# Patient Record
Sex: Male | Born: 1940 | Race: White | Hispanic: No | Marital: Single | Smoking: Never smoker
Health system: Southern US, Community
[De-identification: ages and names within clinical notes are randomized; demographics above are authoritative.]

## PROBLEM LIST (undated history)

## (undated) DIAGNOSIS — I10 Essential (primary) hypertension: Secondary | ICD-10-CM

## (undated) DIAGNOSIS — N289 Disorder of kidney and ureter, unspecified: Secondary | ICD-10-CM

## (undated) DIAGNOSIS — I4891 Unspecified atrial fibrillation: Secondary | ICD-10-CM

## (undated) DIAGNOSIS — I700 Atherosclerosis of aorta: Secondary | ICD-10-CM

## (undated) DIAGNOSIS — I429 Cardiomyopathy, unspecified: Secondary | ICD-10-CM

## (undated) HISTORY — PX: HERNIA REPAIR: SHX51

## (undated) HISTORY — DX: Atherosclerosis of aorta: I70.0

## (undated) HISTORY — DX: Cardiomyopathy, unspecified: I42.9

## (undated) HISTORY — DX: Unspecified atrial fibrillation: I48.91

---

## 2000-12-28 ENCOUNTER — Other Ambulatory Visit: Admission: RE | Admit: 2000-12-28 | Discharge: 2000-12-28 | Payer: Self-pay | Admitting: General Surgery

## 2003-11-05 ENCOUNTER — Inpatient Hospital Stay (HOSPITAL_COMMUNITY): Admission: EM | Admit: 2003-11-05 | Discharge: 2003-11-06 | Payer: Self-pay | Admitting: Emergency Medicine

## 2006-11-01 ENCOUNTER — Emergency Department (HOSPITAL_COMMUNITY): Admission: EM | Admit: 2006-11-01 | Discharge: 2006-11-01 | Payer: Self-pay | Admitting: Emergency Medicine

## 2006-11-11 ENCOUNTER — Observation Stay (HOSPITAL_COMMUNITY): Admission: RE | Admit: 2006-11-11 | Discharge: 2006-11-12 | Payer: Self-pay | Admitting: General Surgery

## 2012-05-19 DIAGNOSIS — I10 Essential (primary) hypertension: Secondary | ICD-10-CM | POA: Diagnosis not present

## 2012-05-19 DIAGNOSIS — E782 Mixed hyperlipidemia: Secondary | ICD-10-CM | POA: Diagnosis not present

## 2012-05-19 DIAGNOSIS — E785 Hyperlipidemia, unspecified: Secondary | ICD-10-CM | POA: Diagnosis not present

## 2012-10-06 ENCOUNTER — Other Ambulatory Visit: Payer: Self-pay | Admitting: Oncology

## 2012-12-30 DIAGNOSIS — I10 Essential (primary) hypertension: Secondary | ICD-10-CM | POA: Diagnosis not present

## 2012-12-30 DIAGNOSIS — E782 Mixed hyperlipidemia: Secondary | ICD-10-CM | POA: Diagnosis not present

## 2012-12-30 DIAGNOSIS — E785 Hyperlipidemia, unspecified: Secondary | ICD-10-CM | POA: Diagnosis not present

## 2013-01-27 DIAGNOSIS — I10 Essential (primary) hypertension: Secondary | ICD-10-CM | POA: Diagnosis not present

## 2013-01-27 DIAGNOSIS — R7301 Impaired fasting glucose: Secondary | ICD-10-CM | POA: Diagnosis not present

## 2013-02-28 DIAGNOSIS — R7309 Other abnormal glucose: Secondary | ICD-10-CM | POA: Diagnosis not present

## 2013-02-28 DIAGNOSIS — I10 Essential (primary) hypertension: Secondary | ICD-10-CM | POA: Diagnosis not present

## 2013-05-31 DIAGNOSIS — I10 Essential (primary) hypertension: Secondary | ICD-10-CM | POA: Diagnosis not present

## 2013-05-31 DIAGNOSIS — E782 Mixed hyperlipidemia: Secondary | ICD-10-CM | POA: Diagnosis not present

## 2013-05-31 DIAGNOSIS — R7309 Other abnormal glucose: Secondary | ICD-10-CM | POA: Diagnosis not present

## 2013-06-07 DIAGNOSIS — E782 Mixed hyperlipidemia: Secondary | ICD-10-CM | POA: Diagnosis not present

## 2013-06-07 DIAGNOSIS — I10 Essential (primary) hypertension: Secondary | ICD-10-CM | POA: Diagnosis not present

## 2013-06-07 DIAGNOSIS — E119 Type 2 diabetes mellitus without complications: Secondary | ICD-10-CM | POA: Diagnosis not present

## 2013-09-07 DIAGNOSIS — R7301 Impaired fasting glucose: Secondary | ICD-10-CM | POA: Diagnosis not present

## 2013-09-07 DIAGNOSIS — E782 Mixed hyperlipidemia: Secondary | ICD-10-CM | POA: Diagnosis not present

## 2013-09-07 DIAGNOSIS — I10 Essential (primary) hypertension: Secondary | ICD-10-CM | POA: Diagnosis not present

## 2013-09-12 DIAGNOSIS — E782 Mixed hyperlipidemia: Secondary | ICD-10-CM | POA: Diagnosis not present

## 2013-09-12 DIAGNOSIS — I10 Essential (primary) hypertension: Secondary | ICD-10-CM | POA: Diagnosis not present

## 2013-09-12 DIAGNOSIS — E119 Type 2 diabetes mellitus without complications: Secondary | ICD-10-CM | POA: Diagnosis not present

## 2014-01-11 DIAGNOSIS — E119 Type 2 diabetes mellitus without complications: Secondary | ICD-10-CM | POA: Diagnosis not present

## 2014-01-11 DIAGNOSIS — E782 Mixed hyperlipidemia: Secondary | ICD-10-CM | POA: Diagnosis not present

## 2014-01-11 DIAGNOSIS — I10 Essential (primary) hypertension: Secondary | ICD-10-CM | POA: Diagnosis not present

## 2014-01-13 DIAGNOSIS — I10 Essential (primary) hypertension: Secondary | ICD-10-CM | POA: Diagnosis not present

## 2014-01-13 DIAGNOSIS — R7309 Other abnormal glucose: Secondary | ICD-10-CM | POA: Diagnosis not present

## 2014-01-13 DIAGNOSIS — E782 Mixed hyperlipidemia: Secondary | ICD-10-CM | POA: Diagnosis not present

## 2014-03-22 DIAGNOSIS — E782 Mixed hyperlipidemia: Secondary | ICD-10-CM | POA: Diagnosis not present

## 2014-03-22 DIAGNOSIS — I10 Essential (primary) hypertension: Secondary | ICD-10-CM | POA: Diagnosis not present

## 2014-05-04 DIAGNOSIS — E782 Mixed hyperlipidemia: Secondary | ICD-10-CM | POA: Diagnosis not present

## 2014-05-04 DIAGNOSIS — R7301 Impaired fasting glucose: Secondary | ICD-10-CM | POA: Diagnosis not present

## 2014-05-04 DIAGNOSIS — I10 Essential (primary) hypertension: Secondary | ICD-10-CM | POA: Diagnosis not present

## 2014-05-09 DIAGNOSIS — E119 Type 2 diabetes mellitus without complications: Secondary | ICD-10-CM | POA: Diagnosis not present

## 2014-05-09 DIAGNOSIS — E782 Mixed hyperlipidemia: Secondary | ICD-10-CM | POA: Diagnosis not present

## 2014-05-09 DIAGNOSIS — I10 Essential (primary) hypertension: Secondary | ICD-10-CM | POA: Diagnosis not present

## 2014-05-30 DIAGNOSIS — Z0000 Encounter for general adult medical examination without abnormal findings: Secondary | ICD-10-CM | POA: Diagnosis not present

## 2014-06-06 DIAGNOSIS — R21 Rash and other nonspecific skin eruption: Secondary | ICD-10-CM | POA: Diagnosis not present

## 2014-06-09 DIAGNOSIS — R21 Rash and other nonspecific skin eruption: Secondary | ICD-10-CM | POA: Diagnosis not present

## 2014-06-21 DIAGNOSIS — R21 Rash and other nonspecific skin eruption: Secondary | ICD-10-CM | POA: Diagnosis not present

## 2014-11-08 DIAGNOSIS — E782 Mixed hyperlipidemia: Secondary | ICD-10-CM | POA: Diagnosis not present

## 2014-11-08 DIAGNOSIS — E119 Type 2 diabetes mellitus without complications: Secondary | ICD-10-CM | POA: Diagnosis not present

## 2014-11-08 DIAGNOSIS — I10 Essential (primary) hypertension: Secondary | ICD-10-CM | POA: Diagnosis not present

## 2014-11-09 DIAGNOSIS — Z0000 Encounter for general adult medical examination without abnormal findings: Secondary | ICD-10-CM | POA: Diagnosis not present

## 2014-11-09 DIAGNOSIS — R21 Rash and other nonspecific skin eruption: Secondary | ICD-10-CM | POA: Diagnosis not present

## 2014-11-09 DIAGNOSIS — Z6834 Body mass index (BMI) 34.0-34.9, adult: Secondary | ICD-10-CM | POA: Diagnosis not present

## 2014-11-09 DIAGNOSIS — E119 Type 2 diabetes mellitus without complications: Secondary | ICD-10-CM | POA: Diagnosis not present

## 2015-02-09 DIAGNOSIS — I10 Essential (primary) hypertension: Secondary | ICD-10-CM | POA: Diagnosis not present

## 2015-02-09 DIAGNOSIS — E119 Type 2 diabetes mellitus without complications: Secondary | ICD-10-CM | POA: Diagnosis not present

## 2015-02-09 DIAGNOSIS — E782 Mixed hyperlipidemia: Secondary | ICD-10-CM | POA: Diagnosis not present

## 2015-02-12 DIAGNOSIS — E785 Hyperlipidemia, unspecified: Secondary | ICD-10-CM | POA: Diagnosis not present

## 2015-02-12 DIAGNOSIS — I10 Essential (primary) hypertension: Secondary | ICD-10-CM | POA: Diagnosis not present

## 2015-02-12 DIAGNOSIS — R7301 Impaired fasting glucose: Secondary | ICD-10-CM | POA: Diagnosis not present

## 2015-08-21 DIAGNOSIS — R7301 Impaired fasting glucose: Secondary | ICD-10-CM | POA: Diagnosis not present

## 2015-08-21 DIAGNOSIS — E785 Hyperlipidemia, unspecified: Secondary | ICD-10-CM | POA: Diagnosis not present

## 2015-08-23 DIAGNOSIS — I10 Essential (primary) hypertension: Secondary | ICD-10-CM | POA: Diagnosis not present

## 2015-08-23 DIAGNOSIS — E782 Mixed hyperlipidemia: Secondary | ICD-10-CM | POA: Diagnosis not present

## 2015-08-23 DIAGNOSIS — R7301 Impaired fasting glucose: Secondary | ICD-10-CM | POA: Diagnosis not present

## 2015-08-23 DIAGNOSIS — R944 Abnormal results of kidney function studies: Secondary | ICD-10-CM | POA: Diagnosis not present

## 2015-09-26 DIAGNOSIS — I10 Essential (primary) hypertension: Secondary | ICD-10-CM | POA: Diagnosis not present

## 2015-10-24 DIAGNOSIS — I10 Essential (primary) hypertension: Secondary | ICD-10-CM | POA: Diagnosis not present

## 2015-10-24 DIAGNOSIS — Z23 Encounter for immunization: Secondary | ICD-10-CM | POA: Diagnosis not present

## 2015-11-27 DIAGNOSIS — R5383 Other fatigue: Secondary | ICD-10-CM | POA: Diagnosis not present

## 2015-11-27 DIAGNOSIS — M791 Myalgia: Secondary | ICD-10-CM | POA: Diagnosis not present

## 2015-11-27 DIAGNOSIS — R1084 Generalized abdominal pain: Secondary | ICD-10-CM | POA: Diagnosis not present

## 2015-11-30 DIAGNOSIS — I10 Essential (primary) hypertension: Secondary | ICD-10-CM | POA: Diagnosis not present

## 2015-11-30 DIAGNOSIS — N50819 Testicular pain, unspecified: Secondary | ICD-10-CM | POA: Diagnosis not present

## 2015-12-05 ENCOUNTER — Other Ambulatory Visit (HOSPITAL_COMMUNITY): Payer: Self-pay | Admitting: Internal Medicine

## 2015-12-05 DIAGNOSIS — N432 Other hydrocele: Secondary | ICD-10-CM

## 2015-12-06 ENCOUNTER — Ambulatory Visit (HOSPITAL_COMMUNITY)
Admission: RE | Admit: 2015-12-06 | Discharge: 2015-12-06 | Disposition: A | Discharge status: Home / Self Care | Payer: Medicare Other | Source: Ambulatory Visit | Attending: Internal Medicine | Admitting: Internal Medicine

## 2015-12-06 DIAGNOSIS — N5089 Other specified disorders of the male genital organs: Secondary | ICD-10-CM | POA: Diagnosis not present

## 2015-12-06 DIAGNOSIS — N432 Other hydrocele: Secondary | ICD-10-CM | POA: Diagnosis not present

## 2015-12-06 DIAGNOSIS — R222 Localized swelling, mass and lump, trunk: Secondary | ICD-10-CM | POA: Diagnosis not present

## 2015-12-19 DIAGNOSIS — Z6837 Body mass index (BMI) 37.0-37.9, adult: Secondary | ICD-10-CM | POA: Diagnosis not present

## 2015-12-19 DIAGNOSIS — E782 Mixed hyperlipidemia: Secondary | ICD-10-CM | POA: Diagnosis not present

## 2015-12-19 DIAGNOSIS — E6609 Other obesity due to excess calories: Secondary | ICD-10-CM | POA: Diagnosis not present

## 2015-12-19 DIAGNOSIS — I10 Essential (primary) hypertension: Secondary | ICD-10-CM | POA: Diagnosis not present

## 2015-12-24 DIAGNOSIS — R7301 Impaired fasting glucose: Secondary | ICD-10-CM | POA: Diagnosis not present

## 2015-12-24 DIAGNOSIS — E782 Mixed hyperlipidemia: Secondary | ICD-10-CM | POA: Diagnosis not present

## 2015-12-24 DIAGNOSIS — I10 Essential (primary) hypertension: Secondary | ICD-10-CM | POA: Diagnosis not present

## 2015-12-26 DIAGNOSIS — I10 Essential (primary) hypertension: Secondary | ICD-10-CM | POA: Diagnosis not present

## 2015-12-26 DIAGNOSIS — R7301 Impaired fasting glucose: Secondary | ICD-10-CM | POA: Diagnosis not present

## 2015-12-26 DIAGNOSIS — E782 Mixed hyperlipidemia: Secondary | ICD-10-CM | POA: Diagnosis not present

## 2016-04-02 DIAGNOSIS — E782 Mixed hyperlipidemia: Secondary | ICD-10-CM | POA: Diagnosis not present

## 2016-04-02 DIAGNOSIS — R7301 Impaired fasting glucose: Secondary | ICD-10-CM | POA: Diagnosis not present

## 2016-04-14 DIAGNOSIS — R7301 Impaired fasting glucose: Secondary | ICD-10-CM | POA: Diagnosis not present

## 2016-04-14 DIAGNOSIS — E782 Mixed hyperlipidemia: Secondary | ICD-10-CM | POA: Diagnosis not present

## 2016-04-14 DIAGNOSIS — R944 Abnormal results of kidney function studies: Secondary | ICD-10-CM | POA: Diagnosis not present

## 2016-04-14 DIAGNOSIS — I10 Essential (primary) hypertension: Secondary | ICD-10-CM | POA: Diagnosis not present

## 2016-04-14 DIAGNOSIS — E6609 Other obesity due to excess calories: Secondary | ICD-10-CM | POA: Diagnosis not present

## 2016-05-21 DIAGNOSIS — I10 Essential (primary) hypertension: Secondary | ICD-10-CM | POA: Diagnosis not present

## 2016-05-21 DIAGNOSIS — R944 Abnormal results of kidney function studies: Secondary | ICD-10-CM | POA: Diagnosis not present

## 2016-08-13 DIAGNOSIS — E782 Mixed hyperlipidemia: Secondary | ICD-10-CM | POA: Diagnosis not present

## 2016-08-13 DIAGNOSIS — R7301 Impaired fasting glucose: Secondary | ICD-10-CM | POA: Diagnosis not present

## 2016-08-20 DIAGNOSIS — R944 Abnormal results of kidney function studies: Secondary | ICD-10-CM | POA: Diagnosis not present

## 2016-08-20 DIAGNOSIS — E782 Mixed hyperlipidemia: Secondary | ICD-10-CM | POA: Diagnosis not present

## 2016-08-20 DIAGNOSIS — I10 Essential (primary) hypertension: Secondary | ICD-10-CM | POA: Diagnosis not present

## 2016-08-20 DIAGNOSIS — R7301 Impaired fasting glucose: Secondary | ICD-10-CM | POA: Diagnosis not present

## 2016-12-10 DIAGNOSIS — R7301 Impaired fasting glucose: Secondary | ICD-10-CM | POA: Diagnosis not present

## 2016-12-10 DIAGNOSIS — E782 Mixed hyperlipidemia: Secondary | ICD-10-CM | POA: Diagnosis not present

## 2016-12-10 DIAGNOSIS — I10 Essential (primary) hypertension: Secondary | ICD-10-CM | POA: Diagnosis not present

## 2016-12-17 DIAGNOSIS — I10 Essential (primary) hypertension: Secondary | ICD-10-CM | POA: Diagnosis not present

## 2016-12-17 DIAGNOSIS — E782 Mixed hyperlipidemia: Secondary | ICD-10-CM | POA: Diagnosis not present

## 2016-12-17 DIAGNOSIS — R7301 Impaired fasting glucose: Secondary | ICD-10-CM | POA: Diagnosis not present

## 2016-12-17 DIAGNOSIS — R944 Abnormal results of kidney function studies: Secondary | ICD-10-CM | POA: Diagnosis not present

## 2016-12-17 DIAGNOSIS — Z6836 Body mass index (BMI) 36.0-36.9, adult: Secondary | ICD-10-CM | POA: Diagnosis not present

## 2016-12-17 DIAGNOSIS — E6609 Other obesity due to excess calories: Secondary | ICD-10-CM | POA: Diagnosis not present

## 2017-03-11 DIAGNOSIS — I10 Essential (primary) hypertension: Secondary | ICD-10-CM | POA: Diagnosis not present

## 2017-03-11 DIAGNOSIS — R7301 Impaired fasting glucose: Secondary | ICD-10-CM | POA: Diagnosis not present

## 2017-03-18 DIAGNOSIS — Z6835 Body mass index (BMI) 35.0-35.9, adult: Secondary | ICD-10-CM | POA: Diagnosis not present

## 2017-03-18 DIAGNOSIS — R944 Abnormal results of kidney function studies: Secondary | ICD-10-CM | POA: Diagnosis not present

## 2017-03-18 DIAGNOSIS — R7301 Impaired fasting glucose: Secondary | ICD-10-CM | POA: Diagnosis not present

## 2017-03-18 DIAGNOSIS — I10 Essential (primary) hypertension: Secondary | ICD-10-CM | POA: Diagnosis not present

## 2017-03-18 DIAGNOSIS — E782 Mixed hyperlipidemia: Secondary | ICD-10-CM | POA: Diagnosis not present

## 2017-03-18 DIAGNOSIS — Z23 Encounter for immunization: Secondary | ICD-10-CM | POA: Diagnosis not present

## 2017-09-02 DIAGNOSIS — R7301 Impaired fasting glucose: Secondary | ICD-10-CM | POA: Diagnosis not present

## 2017-09-02 DIAGNOSIS — I10 Essential (primary) hypertension: Secondary | ICD-10-CM | POA: Diagnosis not present

## 2017-09-02 DIAGNOSIS — R944 Abnormal results of kidney function studies: Secondary | ICD-10-CM | POA: Diagnosis not present

## 2017-09-02 DIAGNOSIS — E782 Mixed hyperlipidemia: Secondary | ICD-10-CM | POA: Diagnosis not present

## 2017-09-08 DIAGNOSIS — E782 Mixed hyperlipidemia: Secondary | ICD-10-CM | POA: Diagnosis not present

## 2017-09-08 DIAGNOSIS — M170 Bilateral primary osteoarthritis of knee: Secondary | ICD-10-CM | POA: Diagnosis not present

## 2017-09-08 DIAGNOSIS — R7301 Impaired fasting glucose: Secondary | ICD-10-CM | POA: Diagnosis not present

## 2017-09-08 DIAGNOSIS — Z6834 Body mass index (BMI) 34.0-34.9, adult: Secondary | ICD-10-CM | POA: Diagnosis not present

## 2017-09-08 DIAGNOSIS — I10 Essential (primary) hypertension: Secondary | ICD-10-CM | POA: Diagnosis not present

## 2017-09-08 DIAGNOSIS — M25569 Pain in unspecified knee: Secondary | ICD-10-CM | POA: Diagnosis not present

## 2017-09-08 DIAGNOSIS — R944 Abnormal results of kidney function studies: Secondary | ICD-10-CM | POA: Diagnosis not present

## 2018-03-05 DIAGNOSIS — E782 Mixed hyperlipidemia: Secondary | ICD-10-CM | POA: Diagnosis not present

## 2018-03-05 DIAGNOSIS — N429 Disorder of prostate, unspecified: Secondary | ICD-10-CM | POA: Diagnosis not present

## 2018-03-05 DIAGNOSIS — R7301 Impaired fasting glucose: Secondary | ICD-10-CM | POA: Diagnosis not present

## 2018-03-10 DIAGNOSIS — Z6835 Body mass index (BMI) 35.0-35.9, adult: Secondary | ICD-10-CM | POA: Diagnosis not present

## 2018-03-10 DIAGNOSIS — E6609 Other obesity due to excess calories: Secondary | ICD-10-CM | POA: Diagnosis not present

## 2018-03-10 DIAGNOSIS — R7301 Impaired fasting glucose: Secondary | ICD-10-CM | POA: Diagnosis not present

## 2018-03-10 DIAGNOSIS — E782 Mixed hyperlipidemia: Secondary | ICD-10-CM | POA: Diagnosis not present

## 2018-03-10 DIAGNOSIS — R944 Abnormal results of kidney function studies: Secondary | ICD-10-CM | POA: Diagnosis not present

## 2018-03-10 DIAGNOSIS — R972 Elevated prostate specific antigen [PSA]: Secondary | ICD-10-CM | POA: Diagnosis not present

## 2018-03-10 DIAGNOSIS — I10 Essential (primary) hypertension: Secondary | ICD-10-CM | POA: Diagnosis not present

## 2018-04-05 DIAGNOSIS — Z6835 Body mass index (BMI) 35.0-35.9, adult: Secondary | ICD-10-CM | POA: Diagnosis not present

## 2018-04-05 DIAGNOSIS — E6609 Other obesity due to excess calories: Secondary | ICD-10-CM | POA: Diagnosis not present

## 2018-04-05 DIAGNOSIS — I10 Essential (primary) hypertension: Secondary | ICD-10-CM | POA: Diagnosis not present

## 2018-04-05 DIAGNOSIS — M170 Bilateral primary osteoarthritis of knee: Secondary | ICD-10-CM | POA: Diagnosis not present

## 2018-04-05 DIAGNOSIS — E782 Mixed hyperlipidemia: Secondary | ICD-10-CM | POA: Diagnosis not present

## 2018-04-05 DIAGNOSIS — M25569 Pain in unspecified knee: Secondary | ICD-10-CM | POA: Diagnosis not present

## 2018-04-05 DIAGNOSIS — R7301 Impaired fasting glucose: Secondary | ICD-10-CM | POA: Diagnosis not present

## 2018-04-05 DIAGNOSIS — Z6837 Body mass index (BMI) 37.0-37.9, adult: Secondary | ICD-10-CM | POA: Diagnosis not present

## 2018-04-05 DIAGNOSIS — R944 Abnormal results of kidney function studies: Secondary | ICD-10-CM | POA: Diagnosis not present

## 2018-04-05 DIAGNOSIS — R972 Elevated prostate specific antigen [PSA]: Secondary | ICD-10-CM | POA: Diagnosis not present

## 2018-04-05 DIAGNOSIS — Z6834 Body mass index (BMI) 34.0-34.9, adult: Secondary | ICD-10-CM | POA: Diagnosis not present

## 2018-09-08 DIAGNOSIS — E782 Mixed hyperlipidemia: Secondary | ICD-10-CM | POA: Diagnosis not present

## 2018-09-08 DIAGNOSIS — Z6837 Body mass index (BMI) 37.0-37.9, adult: Secondary | ICD-10-CM | POA: Diagnosis not present

## 2018-09-08 DIAGNOSIS — I10 Essential (primary) hypertension: Secondary | ICD-10-CM | POA: Diagnosis not present

## 2018-09-08 DIAGNOSIS — R7301 Impaired fasting glucose: Secondary | ICD-10-CM | POA: Diagnosis not present

## 2018-09-08 DIAGNOSIS — R944 Abnormal results of kidney function studies: Secondary | ICD-10-CM | POA: Diagnosis not present

## 2018-09-15 DIAGNOSIS — R7301 Impaired fasting glucose: Secondary | ICD-10-CM | POA: Diagnosis not present

## 2018-09-15 DIAGNOSIS — E669 Obesity, unspecified: Secondary | ICD-10-CM | POA: Diagnosis not present

## 2018-09-15 DIAGNOSIS — M170 Bilateral primary osteoarthritis of knee: Secondary | ICD-10-CM | POA: Diagnosis not present

## 2018-09-15 DIAGNOSIS — S39012A Strain of muscle, fascia and tendon of lower back, initial encounter: Secondary | ICD-10-CM | POA: Diagnosis not present

## 2018-09-15 DIAGNOSIS — Z23 Encounter for immunization: Secondary | ICD-10-CM | POA: Diagnosis not present

## 2018-09-15 DIAGNOSIS — N183 Chronic kidney disease, stage 3 (moderate): Secondary | ICD-10-CM | POA: Diagnosis not present

## 2018-09-15 DIAGNOSIS — I10 Essential (primary) hypertension: Secondary | ICD-10-CM | POA: Diagnosis not present

## 2018-09-15 DIAGNOSIS — R944 Abnormal results of kidney function studies: Secondary | ICD-10-CM | POA: Diagnosis not present

## 2019-02-14 DIAGNOSIS — I10 Essential (primary) hypertension: Secondary | ICD-10-CM | POA: Diagnosis not present

## 2019-02-14 DIAGNOSIS — N183 Chronic kidney disease, stage 3 (moderate): Secondary | ICD-10-CM | POA: Diagnosis not present

## 2019-02-14 DIAGNOSIS — M170 Bilateral primary osteoarthritis of knee: Secondary | ICD-10-CM | POA: Diagnosis not present

## 2019-03-23 DIAGNOSIS — I10 Essential (primary) hypertension: Secondary | ICD-10-CM | POA: Diagnosis not present

## 2019-03-23 DIAGNOSIS — R7301 Impaired fasting glucose: Secondary | ICD-10-CM | POA: Diagnosis not present

## 2019-03-23 DIAGNOSIS — R972 Elevated prostate specific antigen [PSA]: Secondary | ICD-10-CM | POA: Diagnosis not present

## 2019-03-23 DIAGNOSIS — R944 Abnormal results of kidney function studies: Secondary | ICD-10-CM | POA: Diagnosis not present

## 2019-03-23 DIAGNOSIS — E782 Mixed hyperlipidemia: Secondary | ICD-10-CM | POA: Diagnosis not present

## 2019-03-28 DIAGNOSIS — E669 Obesity, unspecified: Secondary | ICD-10-CM | POA: Diagnosis not present

## 2019-03-28 DIAGNOSIS — R600 Localized edema: Secondary | ICD-10-CM | POA: Diagnosis not present

## 2019-03-28 DIAGNOSIS — I10 Essential (primary) hypertension: Secondary | ICD-10-CM | POA: Diagnosis not present

## 2019-03-28 DIAGNOSIS — M25569 Pain in unspecified knee: Secondary | ICD-10-CM | POA: Diagnosis not present

## 2019-03-28 DIAGNOSIS — Z0000 Encounter for general adult medical examination without abnormal findings: Secondary | ICD-10-CM | POA: Diagnosis not present

## 2019-03-28 DIAGNOSIS — M170 Bilateral primary osteoarthritis of knee: Secondary | ICD-10-CM | POA: Diagnosis not present

## 2019-03-28 DIAGNOSIS — R944 Abnormal results of kidney function studies: Secondary | ICD-10-CM | POA: Diagnosis not present

## 2019-03-28 DIAGNOSIS — R7303 Prediabetes: Secondary | ICD-10-CM | POA: Diagnosis not present

## 2019-03-28 DIAGNOSIS — R7301 Impaired fasting glucose: Secondary | ICD-10-CM | POA: Diagnosis not present

## 2019-03-28 DIAGNOSIS — N183 Chronic kidney disease, stage 3 (moderate): Secondary | ICD-10-CM | POA: Diagnosis not present

## 2019-03-28 DIAGNOSIS — E875 Hyperkalemia: Secondary | ICD-10-CM | POA: Diagnosis not present

## 2019-03-28 DIAGNOSIS — S39012A Strain of muscle, fascia and tendon of lower back, initial encounter: Secondary | ICD-10-CM | POA: Diagnosis not present

## 2019-03-31 ENCOUNTER — Other Ambulatory Visit: Payer: Self-pay

## 2019-04-20 DIAGNOSIS — R972 Elevated prostate specific antigen [PSA]: Secondary | ICD-10-CM | POA: Diagnosis not present

## 2019-04-20 DIAGNOSIS — R600 Localized edema: Secondary | ICD-10-CM | POA: Diagnosis not present

## 2019-04-20 DIAGNOSIS — Z6834 Body mass index (BMI) 34.0-34.9, adult: Secondary | ICD-10-CM | POA: Diagnosis not present

## 2019-04-20 DIAGNOSIS — M170 Bilateral primary osteoarthritis of knee: Secondary | ICD-10-CM | POA: Diagnosis not present

## 2019-04-20 DIAGNOSIS — R7301 Impaired fasting glucose: Secondary | ICD-10-CM | POA: Diagnosis not present

## 2019-04-20 DIAGNOSIS — R7303 Prediabetes: Secondary | ICD-10-CM | POA: Diagnosis not present

## 2019-04-20 DIAGNOSIS — E669 Obesity, unspecified: Secondary | ICD-10-CM | POA: Diagnosis not present

## 2019-04-20 DIAGNOSIS — N183 Chronic kidney disease, stage 3 (moderate): Secondary | ICD-10-CM | POA: Diagnosis not present

## 2019-04-20 DIAGNOSIS — I10 Essential (primary) hypertension: Secondary | ICD-10-CM | POA: Diagnosis not present

## 2019-04-20 DIAGNOSIS — M25569 Pain in unspecified knee: Secondary | ICD-10-CM | POA: Diagnosis not present

## 2019-04-20 DIAGNOSIS — Z6837 Body mass index (BMI) 37.0-37.9, adult: Secondary | ICD-10-CM | POA: Diagnosis not present

## 2019-04-20 DIAGNOSIS — E875 Hyperkalemia: Secondary | ICD-10-CM | POA: Diagnosis not present

## 2019-04-20 DIAGNOSIS — E6609 Other obesity due to excess calories: Secondary | ICD-10-CM | POA: Diagnosis not present

## 2019-04-20 DIAGNOSIS — E782 Mixed hyperlipidemia: Secondary | ICD-10-CM | POA: Diagnosis not present

## 2019-04-20 DIAGNOSIS — R944 Abnormal results of kidney function studies: Secondary | ICD-10-CM | POA: Diagnosis not present

## 2019-09-08 DIAGNOSIS — R944 Abnormal results of kidney function studies: Secondary | ICD-10-CM | POA: Diagnosis not present

## 2019-09-08 DIAGNOSIS — I10 Essential (primary) hypertension: Secondary | ICD-10-CM | POA: Diagnosis not present

## 2019-09-08 DIAGNOSIS — R600 Localized edema: Secondary | ICD-10-CM | POA: Diagnosis not present

## 2019-09-08 DIAGNOSIS — R7303 Prediabetes: Secondary | ICD-10-CM | POA: Diagnosis not present

## 2019-09-08 DIAGNOSIS — R7301 Impaired fasting glucose: Secondary | ICD-10-CM | POA: Diagnosis not present

## 2019-09-08 DIAGNOSIS — E875 Hyperkalemia: Secondary | ICD-10-CM | POA: Diagnosis not present

## 2019-09-08 DIAGNOSIS — Z6837 Body mass index (BMI) 37.0-37.9, adult: Secondary | ICD-10-CM | POA: Diagnosis not present

## 2019-09-08 DIAGNOSIS — E782 Mixed hyperlipidemia: Secondary | ICD-10-CM | POA: Diagnosis not present

## 2019-09-08 DIAGNOSIS — R972 Elevated prostate specific antigen [PSA]: Secondary | ICD-10-CM | POA: Diagnosis not present

## 2019-09-08 DIAGNOSIS — M25569 Pain in unspecified knee: Secondary | ICD-10-CM | POA: Diagnosis not present

## 2019-09-08 DIAGNOSIS — E6609 Other obesity due to excess calories: Secondary | ICD-10-CM | POA: Diagnosis not present

## 2019-09-08 DIAGNOSIS — Z6834 Body mass index (BMI) 34.0-34.9, adult: Secondary | ICD-10-CM | POA: Diagnosis not present

## 2020-03-15 DIAGNOSIS — Z6834 Body mass index (BMI) 34.0-34.9, adult: Secondary | ICD-10-CM | POA: Diagnosis not present

## 2020-03-15 DIAGNOSIS — M25569 Pain in unspecified knee: Secondary | ICD-10-CM | POA: Diagnosis not present

## 2020-03-15 DIAGNOSIS — R944 Abnormal results of kidney function studies: Secondary | ICD-10-CM | POA: Diagnosis not present

## 2020-03-15 DIAGNOSIS — R7301 Impaired fasting glucose: Secondary | ICD-10-CM | POA: Diagnosis not present

## 2020-03-15 DIAGNOSIS — E6609 Other obesity due to excess calories: Secondary | ICD-10-CM | POA: Diagnosis not present

## 2020-03-15 DIAGNOSIS — E782 Mixed hyperlipidemia: Secondary | ICD-10-CM | POA: Diagnosis not present

## 2020-03-15 DIAGNOSIS — R7303 Prediabetes: Secondary | ICD-10-CM | POA: Diagnosis not present

## 2020-03-15 DIAGNOSIS — I10 Essential (primary) hypertension: Secondary | ICD-10-CM | POA: Diagnosis not present

## 2020-03-15 DIAGNOSIS — N1831 Chronic kidney disease, stage 3a: Secondary | ICD-10-CM | POA: Diagnosis not present

## 2020-03-15 DIAGNOSIS — E875 Hyperkalemia: Secondary | ICD-10-CM | POA: Diagnosis not present

## 2020-03-15 DIAGNOSIS — R600 Localized edema: Secondary | ICD-10-CM | POA: Diagnosis not present

## 2020-03-15 DIAGNOSIS — Z6837 Body mass index (BMI) 37.0-37.9, adult: Secondary | ICD-10-CM | POA: Diagnosis not present

## 2020-03-20 DIAGNOSIS — M170 Bilateral primary osteoarthritis of knee: Secondary | ICD-10-CM | POA: Diagnosis not present

## 2020-03-20 DIAGNOSIS — E669 Obesity, unspecified: Secondary | ICD-10-CM | POA: Diagnosis not present

## 2020-03-20 DIAGNOSIS — N1831 Chronic kidney disease, stage 3a: Secondary | ICD-10-CM | POA: Diagnosis not present

## 2020-03-20 DIAGNOSIS — R944 Abnormal results of kidney function studies: Secondary | ICD-10-CM | POA: Diagnosis not present

## 2020-03-20 DIAGNOSIS — I10 Essential (primary) hypertension: Secondary | ICD-10-CM | POA: Diagnosis not present

## 2020-03-20 DIAGNOSIS — Z0001 Encounter for general adult medical examination with abnormal findings: Secondary | ICD-10-CM | POA: Diagnosis not present

## 2020-03-20 DIAGNOSIS — R600 Localized edema: Secondary | ICD-10-CM | POA: Diagnosis not present

## 2020-03-20 DIAGNOSIS — R7301 Impaired fasting glucose: Secondary | ICD-10-CM | POA: Diagnosis not present

## 2020-03-20 DIAGNOSIS — R7303 Prediabetes: Secondary | ICD-10-CM | POA: Diagnosis not present

## 2020-03-20 DIAGNOSIS — M25569 Pain in unspecified knee: Secondary | ICD-10-CM | POA: Diagnosis not present

## 2020-04-15 ENCOUNTER — Encounter (HOSPITAL_COMMUNITY): Payer: Self-pay | Admitting: Emergency Medicine

## 2020-04-15 ENCOUNTER — Observation Stay (HOSPITAL_COMMUNITY)
Admission: EM | Admit: 2020-04-15 | Discharge: 2020-04-17 | Disposition: A | Discharge status: Home / Self Care | Payer: Medicare Other | Attending: Internal Medicine | Admitting: Internal Medicine

## 2020-04-15 ENCOUNTER — Emergency Department (HOSPITAL_COMMUNITY): Payer: Medicare Other

## 2020-04-15 ENCOUNTER — Other Ambulatory Visit: Payer: Self-pay

## 2020-04-15 DIAGNOSIS — R0602 Shortness of breath: Secondary | ICD-10-CM | POA: Diagnosis not present

## 2020-04-15 DIAGNOSIS — N179 Acute kidney failure, unspecified: Secondary | ICD-10-CM | POA: Diagnosis not present

## 2020-04-15 DIAGNOSIS — I480 Paroxysmal atrial fibrillation: Secondary | ICD-10-CM | POA: Diagnosis not present

## 2020-04-15 DIAGNOSIS — D3501 Benign neoplasm of right adrenal gland: Secondary | ICD-10-CM

## 2020-04-15 DIAGNOSIS — I129 Hypertensive chronic kidney disease with stage 1 through stage 4 chronic kidney disease, or unspecified chronic kidney disease: Secondary | ICD-10-CM | POA: Diagnosis not present

## 2020-04-15 DIAGNOSIS — N390 Urinary tract infection, site not specified: Secondary | ICD-10-CM

## 2020-04-15 DIAGNOSIS — R600 Localized edema: Secondary | ICD-10-CM | POA: Diagnosis not present

## 2020-04-15 DIAGNOSIS — N400 Enlarged prostate without lower urinary tract symptoms: Secondary | ICD-10-CM

## 2020-04-15 DIAGNOSIS — I4891 Unspecified atrial fibrillation: Secondary | ICD-10-CM | POA: Diagnosis not present

## 2020-04-15 DIAGNOSIS — Z6838 Body mass index (BMI) 38.0-38.9, adult: Secondary | ICD-10-CM | POA: Insufficient documentation

## 2020-04-15 DIAGNOSIS — N401 Enlarged prostate with lower urinary tract symptoms: Secondary | ICD-10-CM | POA: Insufficient documentation

## 2020-04-15 DIAGNOSIS — E785 Hyperlipidemia, unspecified: Secondary | ICD-10-CM | POA: Insufficient documentation

## 2020-04-15 DIAGNOSIS — E669 Obesity, unspecified: Secondary | ICD-10-CM | POA: Diagnosis not present

## 2020-04-15 DIAGNOSIS — Z79899 Other long term (current) drug therapy: Secondary | ICD-10-CM | POA: Insufficient documentation

## 2020-04-15 DIAGNOSIS — R748 Abnormal levels of other serum enzymes: Secondary | ICD-10-CM | POA: Diagnosis not present

## 2020-04-15 DIAGNOSIS — N189 Chronic kidney disease, unspecified: Secondary | ICD-10-CM | POA: Insufficient documentation

## 2020-04-15 DIAGNOSIS — Z20822 Contact with and (suspected) exposure to covid-19: Secondary | ICD-10-CM | POA: Insufficient documentation

## 2020-04-15 DIAGNOSIS — I959 Hypotension, unspecified: Secondary | ICD-10-CM | POA: Diagnosis not present

## 2020-04-15 DIAGNOSIS — R7989 Other specified abnormal findings of blood chemistry: Secondary | ICD-10-CM

## 2020-04-15 DIAGNOSIS — R319 Hematuria, unspecified: Secondary | ICD-10-CM

## 2020-04-15 HISTORY — DX: Disorder of kidney and ureter, unspecified: N28.9

## 2020-04-15 HISTORY — DX: Essential (primary) hypertension: I10

## 2020-04-15 NOTE — ED Triage Notes (Signed)
Pt states he is "peeing blood". States it started at 0400 this morning.

## 2020-04-15 NOTE — ED Provider Notes (Signed)
[facility] EMERGENCY DEPARTMENT Provider Note   CSN: [id number] Arrival date & time: 04/15/20  2301     History Chief Complaint  Patient presents with  . Hematuria    Kevin Rios is a 79 y.o. male.  Patient with history of hypertension and hypercholesterolemia here with hematuria since approximately 4 AM.  States he has been urinating blood mixed with urine all day.  Does have some dysuria.  No abdominal pain or back pain.  No fever or vomiting. On arrival he was found to be tachycardic and hypotensive.  He is in A. fib with RVR with a blood pressure of 90 systolic.  He denies any history of A. fib or CHF.  States he was never told he had any heart issues.  His PCP had him on Lasix in the past which he takes intermittently for swelling but has not had it for several days.  Does not take any blood thinners. He denies any chest pain or shortness of breath.  Has had progressively worsening leg swelling for the past several days.  There is no history of A. fib previously.  He denies any palpitations or chest pain.  He denies any dizziness or lightheadedness.  The history is provided by the patient.  Hematuria Pertinent negatives include no abdominal pain, no headaches and no shortness of breath.       Past Medical History:  Diagnosis Date  . Hypertension   . Renal disorder     There are no problems to display for this patient.   Past Surgical History:  Procedure Laterality Date  . HERNIA REPAIR         History reviewed. No pertinent family history.  Social History   Tobacco Use  . Smoking status: Never Smoker  . Smokeless tobacco: Never Used  Vaping Use  . Vaping Use: Never used  Substance Use Topics  . Alcohol use: Yes    Comment: occassionally  . Drug use: Never    Home Medications Prior to Admission medications   Not on File    Allergies    Patient has no known allergies.  Review of Systems   Review of Systems  Constitutional: Negative for  activity change, appetite change and fever.  HENT: Negative for congestion and rhinorrhea.   Respiratory: Negative for cough, chest tightness and shortness of breath.   Cardiovascular: Positive for palpitations and leg swelling.  Gastrointestinal: Negative for abdominal pain, nausea and vomiting.  Genitourinary: Positive for dysuria and hematuria. Negative for decreased urine volume, flank pain, testicular pain and urgency.  Musculoskeletal: Negative for arthralgias and myalgias.  Skin: Negative for rash.  Neurological: Negative for dizziness, weakness and headaches.   all other systems are negative except as noted in the HPI and PMH.    Physical Exam Updated Vital Signs BP 120/75 (BP Location: Left Arm)   Pulse (!) 123   Temp 98.4 F (36.9 C) (Oral)   Resp (!) 23   Ht 5\' 8"  (1.727 m)   Wt 113.4 kg   SpO2 96%   BMI 38.01 kg/m   Physical Exam Vitals and nursing note reviewed.  Constitutional:      General: He is not in acute distress.    Appearance: He is well-developed. He is not ill-appearing.     Comments: Disheveled, dyspneic with conversation  HENT:     Head: Normocephalic and atraumatic.     Nose: Nose normal. No congestion or rhinorrhea.     Mouth/Throat:  Mouth: Mucous membranes are moist.     Pharynx: No oropharyngeal exudate.  Eyes:     Conjunctiva/sclera: Conjunctivae normal.     Pupils: Pupils are equal, round, and reactive to light.  Neck:     Comments: No meningismus. Cardiovascular:     Rate and Rhythm: Tachycardia present. Rhythm irregular.     Heart sounds: Normal heart sounds. No murmur heard.      Comments: irregular tachycardia 120s to 140s Pulmonary:     Effort: Pulmonary effort is normal. No respiratory distress.     Breath sounds: Normal breath sounds.  Abdominal:     Palpations: Abdomen is soft.     Tenderness: There is no abdominal tenderness. There is no guarding or rebound.  Genitourinary:    Comments: No testicular tenderness.  No  frank blood noted to the urethral meatus Musculoskeletal:        General: No tenderness. Normal range of motion.     Cervical back: Normal range of motion and neck supple.     Right lower leg: Edema present.     Left lower leg: Edema present.     Comments: No CVA tenderness  +2 edema to knees bilaterally  Skin:    General: Skin is warm.  Neurological:     Mental Status: He is alert and oriented to person, place, and time.     Cranial Nerves: No cranial nerve deficit.     Motor: No abnormal muscle tone.     Coordination: Coordination normal.     Comments: No ataxia on finger to nose bilaterally. No pronator drift. 5/5 strength throughout. CN 2-12 intact.Equal grip strength. Sensation intact.   Psychiatric:        Behavior: Behavior normal.     ED Results / Procedures / Treatments   Labs (all labs ordered are listed, but only abnormal results are displayed) Labs Reviewed  URINALYSIS, ROUTINE W REFLEX MICROSCOPIC - Abnormal; Notable for the following components:      Result Value   Hgb urine dipstick LARGE (*)    Bacteria, UA RARE (*)    All other components within normal limits  CBC WITH DIFFERENTIAL/PLATELET - Abnormal; Notable for the following components:   Neutro Abs 8.4 (*)    Lymphs Abs 0.4 (*)    All other components within normal limits  COMPREHENSIVE METABOLIC PANEL - Abnormal; Notable for the following components:   CO2 19 (*)    Glucose, Bld 117 (*)    BUN 33 (*)    Creatinine, Ser 1.72 (*)    Calcium 8.8 (*)    GFR calc non Af Amer 38 (*)    GFR calc Af Amer 44 (*)    All other components within normal limits  BRAIN NATRIURETIC PEPTIDE - Abnormal; Notable for the following components:   B Natriuretic Peptide 145.0 (*)    All other components within normal limits  SARS CORONAVIRUS 2 BY RT PCR (HOSPITAL ORDER, [place] [facility])  URINE CULTURE  CK  TROPONIN I (HIGH SENSITIVITY)  TROPONIN I (HIGH SENSITIVITY)    EKG EKG  Interpretation  Date/Time:  Sunday April 15 2020 23:25:20 EDT Ventricular Rate:  123 PR Interval:    QRS Duration: 73 QT Interval:  282 QTC Calculation: 404 R Axis:   37 Text Interpretation: Atrial fibrillation Low voltage, precordial leads Anteroseptal infarct, old Borderline repolarization abnormality afib with RVR Nonspecific ST abnormality Confirmed by Ezequiel Essex [phone]) on 04/15/2020 11:41:53 PM   Radiology DG Chest  Portable 1 View  Result Date: 04/16/2020 CLINICAL DATA:  Shortness of breath EXAM: PORTABLE CHEST 1 VIEW COMPARISON:  None. FINDINGS: The heart size and mediastinal contours are within normal limits. Both lungs are clear. The visualized skeletal structures are unremarkable. IMPRESSION: No active disease. Electronically Signed   By: Donavan Foil M.D.   On: 04/16/2020 00:54   CT Renal Stone Study  Result Date: 04/16/2020 CLINICAL DATA:  79 year old with hematuria. EXAM: CT ABDOMEN AND PELVIS WITHOUT CONTRAST TECHNIQUE: Multidetector CT imaging of the abdomen and pelvis was performed following the standard protocol without IV contrast. COMPARISON:  Remote pelvis CT 11/01/2006 FINDINGS: Lower chest: Calcified granuloma in the right lower lobe. There is a tiny subpleural nodule in the right lower lobe, series 4, image 3. No pleural fluid or focal airspace disease. Hepatobiliary: Slight generalized increased hepatic density consistent with amiodarone therapy. No focal hepatic abnormality. Punctate calcified granuloma. Gallbladder physiologically distended, no calcified stone. No biliary dilatation. Pancreas: No ductal dilatation or inflammation. Mild fatty atrophy of the pancreatic head. Spleen: Normal in size without focal abnormality. Adrenals/Urinary Tract: 14 mm low-density nodule in the right adrenal gland consistent with adenoma. The left adrenal gland is normal. There is bilateral renal parenchymal thinning. Symmetric bilateral perinephric edema. No hydronephrosis. No  renal calculi. Suspect small cortical and parapelvic cysts in the left kidney. No evidence of solid renal lesion. Both ureters are decompressed without stones along the course. Urinary bladder is partially distended. There is no bladder wall thickening. No bladder stone. No obvious bladder mass. Stomach/Bowel: Ingested material in the stomach. Normal positioning of the duodenum and ligament of Treitz. There is no small bowel obstruction or inflammatory change. Mild fecalization of distal small bowel contents. Diminutive appendix tentatively visual. No appendicitis. Small to moderate colonic stool burden. Mild diverticulosis involving the descending and sigmoid colon. No diverticulitis. Vascular/Lymphatic: Aorto bi-iliac atherosclerosis. No aortic aneurysm. No enlarged lymph nodes in the abdomen or pelvis. No inguinal adenopathy. Reproductive: Enlarged prostate gland spanning 5.7 x 4.5 x 5.7 cm (volume = 77 cm^3). This causes mass effect on the bladder base. Other: Moderate to large right and small left fat containing inguinal hernia. No bowel involvement. No ascites. No free air. Musculoskeletal: Degenerative change in the lumbar spine and both hips. There are no acute or suspicious osseous abnormalities. IMPRESSION: 1. No renal stones or obstructive uropathy. No explanation for hematuria. Consider elective pre and postcontrast hematuria protocol CT should symptoms persist. 2. Suspected small cortical and parapelvic cysts in the left kidney, not well assessed in the absence of IV contrast. 3. Enlarged prostate gland causing mass effect on the bladder base. Recommend correlation with PSA. 4. Moderate to large right and small left fat containing inguinal hernia. No bowel involvement. 5. Mild colonic diverticulosis without diverticulitis. 6. Right adrenal adenoma. Aortic Atherosclerosis (ICD10-I70.0). Electronically Signed   By: Keith Rake M.D.   On: 04/16/2020 01:24    Procedures .Critical Care Performed  by: Ezequiel Essex, MD Authorized by: Ezequiel Essex, MD   Critical care provider statement:    Critical care time (minutes):  45   Critical care was necessary to treat or prevent imminent or life-threatening deterioration of the following conditions:  Cardiac failure (Atrial fibrillation with RVR)   Critical care was time spent personally by me on the following activities:  Discussions with consultants, evaluation of patient's response to treatment, examination of patient, ordering and performing treatments and interventions, ordering and review of laboratory studies, ordering and review of radiographic studies, pulse  oximetry, re-evaluation of patient's condition, obtaining history from patient or surrogate and review of old charts   (including critical care time)  Medications Ordered in ED Medications - No data to display  ED Course  I have reviewed the triage vital signs and the nursing notes.  Pertinent labs & imaging results that were available during my care of the patient were reviewed by me and considered in my medical decision making (see chart for details).    MDM Rules/Calculators/A&P                         Patient presents with hematuria and dysuria since 4 AM.  He is found to be in A. fib with RVR, hypotensive, dyspneic with leg swelling.  Concern for decompensated heart failure.  Patient is new atrial fibrillation with RVR.  He was placed on amiodarone for rate control given his soft blood pressures.  He denies any chest pain or shortness of breath but does appear dyspneic with conversation.  Urine is positive for hematuria without evidence of infection.  Culture is sent.  CT scan shows no explanation for hematuria, no obstructive stones or masses. Creatinine 1.7 without comparison. Heart rate improved to 90s and 100s.  Blood pressure improved to 726 systolic.  Chest x-ray is clear.  BNP is normal.  Does have peripheral edema.  Continue amiodarone for rate  control.  Will give Rocephin for possible hemorrhagic cystitis.  Admission for A. fib with RVR discussed with Dr. Josephine Cables.   Final Clinical Impression(s) / ED Diagnoses Final diagnoses:  Hematuria, unspecified type  Atrial fibrillation with RVR [facility])    Rx / DC Orders ED Discharge Orders    None       Cartrell Bentsen, Annie Main, MD 04/16/20 [id number]

## 2020-04-16 ENCOUNTER — Emergency Department (HOSPITAL_COMMUNITY): Payer: Medicare Other

## 2020-04-16 ENCOUNTER — Observation Stay (HOSPITAL_BASED_OUTPATIENT_CLINIC_OR_DEPARTMENT_OTHER): Payer: Medicare Other

## 2020-04-16 ENCOUNTER — Other Ambulatory Visit: Payer: Self-pay

## 2020-04-16 ENCOUNTER — Encounter (HOSPITAL_COMMUNITY): Payer: Self-pay | Admitting: Internal Medicine

## 2020-04-16 DIAGNOSIS — R7989 Other specified abnormal findings of blood chemistry: Secondary | ICD-10-CM

## 2020-04-16 DIAGNOSIS — I4891 Unspecified atrial fibrillation: Secondary | ICD-10-CM | POA: Diagnosis not present

## 2020-04-16 DIAGNOSIS — E669 Obesity, unspecified: Secondary | ICD-10-CM | POA: Diagnosis not present

## 2020-04-16 DIAGNOSIS — I480 Paroxysmal atrial fibrillation: Secondary | ICD-10-CM | POA: Diagnosis not present

## 2020-04-16 DIAGNOSIS — Z6838 Body mass index (BMI) 38.0-38.9, adult: Secondary | ICD-10-CM

## 2020-04-16 DIAGNOSIS — N390 Urinary tract infection, site not specified: Secondary | ICD-10-CM | POA: Diagnosis not present

## 2020-04-16 DIAGNOSIS — N400 Enlarged prostate without lower urinary tract symptoms: Secondary | ICD-10-CM | POA: Diagnosis not present

## 2020-04-16 DIAGNOSIS — I5031 Acute diastolic (congestive) heart failure: Secondary | ICD-10-CM

## 2020-04-16 DIAGNOSIS — N179 Acute kidney failure, unspecified: Secondary | ICD-10-CM

## 2020-04-16 DIAGNOSIS — R319 Hematuria, unspecified: Secondary | ICD-10-CM

## 2020-04-16 DIAGNOSIS — D3501 Benign neoplasm of right adrenal gland: Secondary | ICD-10-CM | POA: Diagnosis not present

## 2020-04-16 DIAGNOSIS — R0602 Shortness of breath: Secondary | ICD-10-CM | POA: Diagnosis not present

## 2020-04-16 LAB — CBC WITH DIFFERENTIAL/PLATELET
Abs Immature Granulocytes: 0.02 10*3/uL (ref 0.00–0.07)
Basophils Absolute: 0 10*3/uL (ref 0.0–0.1)
Basophils Relative: 0 %
Eosinophils Absolute: 0 10*3/uL (ref 0.0–0.5)
Eosinophils Relative: 0 %
HCT: 43.8 % (ref 39.0–52.0)
Hemoglobin: 14.3 g/dL (ref 13.0–17.0)
Immature Granulocytes: 0 %
Lymphocytes Relative: 5 %
Lymphs Abs: 0.4 10*3/uL — ABNORMAL LOW (ref 0.7–4.0)
MCH: 31 pg (ref 26.0–34.0)
MCHC: 32.6 g/dL (ref 30.0–36.0)
MCV: 95 fL (ref 80.0–100.0)
Monocytes Absolute: 0.1 10*3/uL (ref 0.1–1.0)
Monocytes Relative: 1 %
Neutro Abs: 8.4 10*3/uL — ABNORMAL HIGH (ref 1.7–7.7)
Neutrophils Relative %: 94 %
Platelets: 234 10*3/uL (ref 150–400)
RBC: 4.61 MIL/uL (ref 4.22–5.81)
RDW: 13.5 % (ref 11.5–15.5)
WBC: 9 10*3/uL (ref 4.0–10.5)
nRBC: 0 % (ref 0.0–0.2)

## 2020-04-16 LAB — COMPREHENSIVE METABOLIC PANEL
ALT: 16 U/L (ref 0–44)
AST: 21 U/L (ref 15–41)
Albumin: 4 g/dL (ref 3.5–5.0)
Alkaline Phosphatase: 51 U/L (ref 38–126)
Anion gap: 11 (ref 5–15)
BUN: 33 mg/dL — ABNORMAL HIGH (ref 8–23)
CO2: 19 mmol/L — ABNORMAL LOW (ref 22–32)
Calcium: 8.8 mg/dL — ABNORMAL LOW (ref 8.9–10.3)
Chloride: 106 mmol/L (ref 98–111)
Creatinine, Ser: 1.72 mg/dL — ABNORMAL HIGH (ref 0.61–1.24)
GFR calc Af Amer: 44 mL/min — ABNORMAL LOW (ref >60)
GFR calc non Af Amer: 38 mL/min — ABNORMAL LOW (ref >60)
Glucose, Bld: 117 mg/dL — ABNORMAL HIGH (ref 70–99)
Potassium: 4.1 mmol/L (ref 3.5–5.1)
Sodium: 136 mmol/L (ref 135–145)
Total Bilirubin: 0.7 mg/dL (ref 0.3–1.2)
Total Protein: 6.8 g/dL (ref 6.5–8.1)

## 2020-04-16 LAB — URINALYSIS, ROUTINE W REFLEX MICROSCOPIC
Bilirubin Urine: NEGATIVE
Glucose, UA: NEGATIVE mg/dL
Ketones, ur: NEGATIVE mg/dL
Leukocytes,Ua: NEGATIVE
Nitrite: NEGATIVE
Protein, ur: NEGATIVE mg/dL
Specific Gravity, Urine: 1.015 (ref 1.005–1.030)
pH: 5 (ref 5.0–8.0)

## 2020-04-16 LAB — ECHOCARDIOGRAM COMPLETE
Area-P 1/2: 5.35 cm2
Height: 68 in
S' Lateral: 3.06 cm
Weight: 4000 oz

## 2020-04-16 LAB — PSA: Prostatic Specific Antigen: 0.82 ng/mL (ref 0.00–4.00)

## 2020-04-16 LAB — TROPONIN I (HIGH SENSITIVITY)
Troponin I (High Sensitivity): 5 ng/L (ref <18)
Troponin I (High Sensitivity): 5 ng/L (ref <18)

## 2020-04-16 LAB — CK: Total CK: 362 U/L (ref 49–397)

## 2020-04-16 LAB — SARS CORONAVIRUS 2 BY RT PCR (HOSPITAL ORDER, PERFORMED IN ~~LOC~~ HOSPITAL LAB): SARS Coronavirus 2: NEGATIVE

## 2020-04-16 LAB — BRAIN NATRIURETIC PEPTIDE: B Natriuretic Peptide: 145 pg/mL — ABNORMAL HIGH (ref 0.0–100.0)

## 2020-04-16 MED ORDER — SODIUM CHLORIDE 0.9 % IV SOLN
1.0000 g | Freq: Once | INTRAVENOUS | Status: AC
  Administered 2020-04-16: 1 g via INTRAVENOUS
  Filled 2020-04-16: qty 10

## 2020-04-16 MED ORDER — POLYETHYLENE GLYCOL 3350 17 G PO PACK: 17.0000 g | PACK | Freq: Every day | ORAL | Status: DC | PRN

## 2020-04-16 MED ORDER — SODIUM CHLORIDE 0.9 % IV SOLN: Freq: Once | INTRAVENOUS | Status: AC

## 2020-04-16 MED ORDER — SODIUM CHLORIDE 0.9 % IV SOLN: INTRAVENOUS | Status: AC

## 2020-04-16 MED ORDER — METOPROLOL TARTRATE 5 MG/5ML IV SOLN
5.0000 mg | INTRAVENOUS | Status: DC | PRN
  Administered 2020-04-16: 5 mg via INTRAVENOUS
  Filled 2020-04-16: qty 5

## 2020-04-16 MED ORDER — ACETAMINOPHEN 650 MG RE SUPP: 650.0000 mg | Freq: Four times a day (QID) | RECTAL | Status: DC | PRN

## 2020-04-16 MED ORDER — ACETAMINOPHEN 325 MG PO TABS: 650.0000 mg | ORAL_TABLET | Freq: Four times a day (QID) | ORAL | Status: DC | PRN

## 2020-04-16 MED ORDER — AMIODARONE HCL IN DEXTROSE 360-4.14 MG/200ML-% IV SOLN
60.0000 mg/h | INTRAVENOUS | Status: AC
  Administered 2020-04-16: 60 mg/h via INTRAVENOUS
  Filled 2020-04-16: qty 200

## 2020-04-16 MED ORDER — AMIODARONE LOAD VIA INFUSION
150.0000 mg | Freq: Once | INTRAVENOUS | Status: AC
Start: 2020-04-16 — End: 2020-04-16
  Administered 2020-04-16: 150 mg via INTRAVENOUS
  Filled 2020-04-16: qty 83.34

## 2020-04-16 MED ORDER — AMIODARONE HCL IN DEXTROSE 360-4.14 MG/200ML-% IV SOLN
30.0000 mg/h | INTRAVENOUS | Status: DC
  Filled 2020-04-16 (×4): qty 200

## 2020-04-16 MED ORDER — TAMSULOSIN HCL 0.4 MG PO CAPS
0.4000 mg | ORAL_CAPSULE | Freq: Every day | ORAL | Status: DC
  Administered 2020-04-16: 0.4 mg via ORAL
  Filled 2020-04-16: qty 1

## 2020-04-16 MED ORDER — SENNOSIDES-DOCUSATE SODIUM 8.6-50 MG PO TABS
2.0000 | ORAL_TABLET | Freq: Every evening | ORAL | Status: DC | PRN
  Filled 2020-04-16: qty 2

## 2020-04-16 MED ORDER — SODIUM CHLORIDE 0.9 % IV SOLN
1.0000 g | INTRAVENOUS | Status: DC
  Administered 2020-04-17: 1 g via INTRAVENOUS
  Filled 2020-04-16: qty 10

## 2020-04-16 MED ORDER — CALCIUM CARBONATE 1250 (500 CA) MG PO TABS
1.0000 | ORAL_TABLET | Freq: Every day | ORAL | Status: DC
  Filled 2020-04-16 (×4): qty 1

## 2020-04-16 MED ORDER — SODIUM CHLORIDE 0.9 % IV BOLUS
500.0000 mL | Freq: Once | INTRAVENOUS | Status: AC
  Administered 2020-04-16: 500 mL via INTRAVENOUS

## 2020-04-16 NOTE — Consult Note (Addendum)
Cardiology Consultation:   Patient ID: Kevin Rios MRN: [id number]; DOB: 02/19/1941  Admit date: 04/15/2020 Date of Consult: 04/16/2020  Primary Care Provider: Celene Squibb, MD [facility] Cardiologist: Rozann Lesches, MD  new [facility] Electrophysiologist:  None    Patient Profile:   Kevin Rios is a 79 y.o. male with a hx of HTN and possible renal insufficiency who is being seen today for the evaluation of Afib with RVR at the request of Dr. Josephine Cables.  History of Present Illness:   Mr. Kevin Rios is a 79 yo male with HTN, CKD, who presented with hematuria and found incidentally to be in Afib with RVR. 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  He reportedly had a good check up by Dr. [place] Crane 2 weeks ago. Denies chest pain, palpitations, dizziness or presyncpe. Does't smoke, drinks 2-3 beers daily. Father died MI 40's, 4 brothers died but only one with heart. Doesn't know meds he takes at home.  States that he started to develop hematuria around 4 PM on Saturday. Worst episode was the initial one, it has been less since then.  He felt "weird" on Sunday evening and decided to come into the ER.  No sense of palpitations at that time.   Past Medical History:  Diagnosis Date  . Essential hypertension   . Renal insufficiency    Possible history    Past Surgical History:  Procedure Laterality Date  . HERNIA REPAIR       Home Medications:  Prior to Admission medications   Not on File    Inpatient Medications: Scheduled Meds: . calcium carbonate  1 tablet Oral Q breakfast  . tamsulosin  0.4 mg Oral QPC supper   Continuous Infusions: . sodium chloride 75 mL/hr at 04/16/20 0817  . amiodarone Stopped (04/16/20 0557)  . [START ON 04/17/2020] cefTRIAXone (ROCEPHIN)  IV     PRN Meds: acetaminophen, metoprolol tartrate, polyethylene glycol, senna-docusate  Allergies:   No  Known Allergies  Social History:   Social History   Tobacco Use  . Smoking status: Never Smoker  . Smokeless tobacco: Never Used  Substance Use Topics  . Alcohol use: Yes    Comment: occassionally    Family History:      Family History  Problem Relation Age of Onset  . Hypertension Father      ROS:  Please see the history of present illness.  Review of Systems  Constitutional: Negative.  HENT: Negative.   Cardiovascular: Positive for dyspnea on exertion.  Respiratory: Negative.   Endocrine: Negative.   Hematologic/Lymphatic: Negative.   Musculoskeletal: Negative.   Gastrointestinal: Negative.   Genitourinary: Positive for hematuria, hesitancy and urgency.  Neurological: Negative.     All other ROS reviewed and negative.     Physical Exam/Data:   Vitals:   04/16/20 0900 04/16/20 1000 04/16/20 1147 04/16/20 1149  BP: 140/76   127/75  Pulse:  83 66   Resp: 15 16 17 17   Temp:      TempSrc:      SpO2: 96% 95% 95%   Weight:      Height:        Intake/Output Summary (Last 24 hours) at 04/16/2020 1217 Last data filed at 04/16/2020 0816 Gross per 24 hour  Intake 600 ml  Output 1275 ml  Net -675 ml   Last 3 Weights 04/15/2020  Weight (lbs) 250 lb  Weight (  kg) 113.399 kg     Body mass index is 38.01 kg/m.  General:  Obese, in no acute distress HEENT: normal Lymph: no adenopathy Neck: no JVD Endocrine:  No thryomegaly Vascular: No carotid bruits; FA pulses 2+ bilaterally without bruits  Cardiac:  normal S1, S2; irreg; no murmur  Lungs:  clear to auscultation bilaterally, no wheezing, rhonchi or rales  Abd: soft, nontender, no hepatomegaly  Ext: no edema Musculoskeletal:  No deformities, BUE and BLE strength normal and equal Skin: warm and dry  Neuro:  CNs 2-12 intact, no focal abnormalities noted Psych:  Normal affect   EKG:  The EKG was personally reviewed and demonstrates:  Atrial fib 123/m old antsept infarct. No EKG's to compare Telemetry:   Telemetry was personally reviewed and demonstrates:  Afib at 80/m  Relevant CV Studies: Echo pending  Laboratory Data:  High Sensitivity Troponin:   Recent Labs  Lab 04/15/20 2337 04/16/20 0155  TROPONINIHS 5 5     Chemistry Recent Labs  Lab 04/15/20 2337  NA 136  K 4.1  CL 106  CO2 19*  GLUCOSE 117*  BUN 33*  CREATININE 1.72*  CALCIUM 8.8*  GFRNONAA 38*  GFRAA 44*  ANIONGAP 11    Recent Labs  Lab 04/15/20 2337  PROT 6.8  ALBUMIN 4.0  AST 21  ALT 16  ALKPHOS 51  BILITOT 0.7   Hematology Recent Labs  Lab 04/15/20 2337  WBC 9.0  RBC 4.61  HGB 14.3  HCT 43.8  MCV 95.0  MCH 31.0  MCHC 32.6  RDW 13.5  PLT 234   BNP Recent Labs  Lab 04/15/20 2337  BNP 145.0*     Radiology/Studies:  DG Chest Portable 1 View  Result Date: 04/16/2020 CLINICAL DATA:  Shortness of breath EXAM: PORTABLE CHEST 1 VIEW COMPARISON:  None. FINDINGS: The heart size and mediastinal contours are within normal limits. Both lungs are clear. The visualized skeletal structures are unremarkable. IMPRESSION: No active disease. Electronically Signed   By: Donavan Foil M.D.   On: 04/16/2020 00:54   CT Renal Stone Study  Result Date: 04/16/2020 CLINICAL DATA:  79 year old with hematuria. EXAM: CT ABDOMEN AND PELVIS WITHOUT CONTRAST TECHNIQUE: Multidetector CT imaging of the abdomen and pelvis was performed following the standard protocol without IV contrast. COMPARISON:  Remote pelvis CT 11/01/2006 FINDINGS: Lower chest: Calcified granuloma in the right lower lobe. There is a tiny subpleural nodule in the right lower lobe, series 4, image 3. No pleural fluid or focal airspace disease. Hepatobiliary: Slight generalized increased hepatic density consistent with amiodarone therapy. No focal hepatic abnormality. Punctate calcified granuloma. Gallbladder physiologically distended, no calcified stone. No biliary dilatation. Pancreas: No ductal dilatation or inflammation. Mild fatty atrophy of the  pancreatic head. Spleen: Normal in size without focal abnormality. Adrenals/Urinary Tract: 14 mm low-density nodule in the right adrenal gland consistent with adenoma. The left adrenal gland is normal. There is bilateral renal parenchymal thinning. Symmetric bilateral perinephric edema. No hydronephrosis. No renal calculi. Suspect small cortical and parapelvic cysts in the left kidney. No evidence of solid renal lesion. Both ureters are decompressed without stones along the course. Urinary bladder is partially distended. There is no bladder wall thickening. No bladder stone. No obvious bladder mass. Stomach/Bowel: Ingested material in the stomach. Normal positioning of the duodenum and ligament of Treitz. There is no small bowel obstruction or inflammatory change. Mild fecalization of distal small bowel contents. Diminutive appendix tentatively visual. No appendicitis. Small to moderate colonic stool burden. Mild  diverticulosis involving the descending and sigmoid colon. No diverticulitis. Vascular/Lymphatic: Aorto bi-iliac atherosclerosis. No aortic aneurysm. No enlarged lymph nodes in the abdomen or pelvis. No inguinal adenopathy. Reproductive: Enlarged prostate gland spanning 5.7 x 4.5 x 5.7 cm (volume = 77 cm^3). This causes mass effect on the bladder base. Other: Moderate to large right and small left fat containing inguinal hernia. No bowel involvement. No ascites. No free air. Musculoskeletal: Degenerative change in the lumbar spine and both hips. There are no acute or suspicious osseous abnormalities. IMPRESSION: 1. No renal stones or obstructive uropathy. No explanation for hematuria. Consider elective pre and postcontrast hematuria protocol CT should symptoms persist. 2. Suspected small cortical and parapelvic cysts in the left kidney, not well assessed in the absence of IV contrast. 3. Enlarged prostate gland causing mass effect on the bladder base. Recommend correlation with PSA. 4. Moderate to large  right and small left fat containing inguinal hernia. No bowel involvement. 5. Mild colonic diverticulosis without diverticulitis. 6. Right adrenal adenoma. Aortic Atherosclerosis (ICD10-I70.0). Electronically Signed   By: Keith Rake M.D.   On: 04/16/2020 01:24    Assessment and Plan:   1. Atrial fib with RVR CHADS2VASC=3(age & HTN) in setting of UTI, hematuria, echo pending. Rate now controlled with IV amiodarone-probably not best drug long term. Can use eliquis if hematuria resolves. Metoprolol for rate control. Will discuss with MD 2. UTI 3. Hypotension treated with fluids 4. HTN-controlled but doesn't know what he takes at home 5. HLD 6. 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 Right adrenal adenoma on CT abd/pelvis      For questions or updates, please contact [place] Please consult [email or web address] for contact info under    Signed, Ermalinda Barrios, PA-C 04/16/2020 12:17 PM   Attending note:  Patient seen and examined.  I reviewed available records and discussed the case with Ms. Bonnell Public PA-C.  Mr. Slowey is a patient of Dr. Nevada Crane with a history of hypertension, possibly CKD stage III, but information is not complete at this time.  He presented to the [facility] ER complaining of recent onset recurring hematuria since Saturday afternoon.  Initially felt a burning when he urinated, since then symptoms have been less, but he felt progressively "weird" on Sunday evening prompting his ER visit.  He was incidentally noted to be in rapid atrial fibrillation, presumably a new diagnosis.  He was placed on IV amiodarone initially by ER staff, heart rate is controlled.  At baseline he does not report any recurring sense of palpitations.  On examination he is in no distress.  Does not report any chest pain or palpitations.  Lungs are clear without labored breathing.  Cardiac exam with irregularly irregular rhythm.  Systolic blood pressure ranging 80s to 140s.  Heart rate is presently in the 80s in  atrial fibrillation.  Pertinent lab work includes potassium 4.1, creatinine 1.72, normal LFTs, normal high-sensitivity troponin I levels, BNP 145, hemoglobin 14.3, platelets 234.  I personally reviewed his tracing from 04/15/2020 which showed atrial fibrillation at 123 bpm, decreased R wave progression and low voltage.  CT of the abdomen does not show any obvious nephrolithiasis or evidence of obstructive uropathy.  Atrial fibrillation, duration uncertain and presenting with mild RVR.  CHA2DS2-VASc score is at least 3.  He does not report any regular sense of palpitations.  Initially came to the ER because he felt "weird" with recent recurring hematuria.  Would stop amiodarone and initiate low-dose beta-blocker most likely, need to check on his  outpatient antihypertensive medication list first.  Check TSH.  Would not initiate anticoagulation until etiology and course of hematuria has been clarified.  He would otherwise be a reasonable candidate for DOAC.  We will follow up on the echocardiogram that has been ordered.  Satira Sark, M.D., F.A.C.C.

## 2020-04-16 NOTE — ED Notes (Signed)
Patient transported to CT 

## 2020-04-16 NOTE — H&P (Signed)
History and Physical  [name] SFK:[id number] DOB: October 02, 1940 DOA: 04/15/2020  Referring physician: Ezequiel Essex, MD PCP: Celene Squibb, MD  Patient coming from: Home  Chief Complaint: " I was urinating blood"  HPI: Kevin Rios is a 79 y.o. male with medical history significant for hypertension and hypercholesterolemia who presents to the emergency department due to blood in urine.  Patient states that he noted blood in urine when he woke up yesterday (8/15) to urinate and that he has been seeing blood mixed with his urine all day, he also complained of painful urination which started about the same time.  He was worried about this and decided to go to the ED for further evaluation.  Patient denies fever, chills, chest pain, shortness of breath, abdominal pain or back pain.  ED Course:  In the emergency department, he was noted to be in A. fib.  Work-up in the ED showed normal CBC, BUN to creatinine 33/1.72 (no prior labs for comparison).  BNP 145, calcium 8.8, troponin x2-5, BNP 145.  SARS coronavirus 2 was negative.  CT abdomen and pelvis without contrast showed no renal stones or obstructive uropathy.  Enlarged prostate gland causing mass-effect on the bladder base noted, right adrenal adenoma noted.  Chest x-ray showed no active disease.  IV amiodarone was started, IV ceftriaxone due to presumed UTI was started.  Hospitalist was asked to admit patient for further evaluation and management.  Review of Systems: Constitutional: Negative for chills and fever.  HENT: Negative for ear pain and sore throat.   Eyes: Negative for pain and visual disturbance.  Respiratory: Negative for cough, chest tightness and shortness of breath.   Cardiovascular: Positive for palpitations.  Negative for for chest pain  Gastrointestinal: Negative for abdominal pain and vomiting.  Endocrine: Negative for polyphagia and polyuria.  Genitourinary: Positive for painful urination and bloody urine.     Musculoskeletal: Negative for arthralgias and back pain.  Skin: Negative for color change and rash.  Allergic/Immunologic: Negative for immunocompromised state.  Neurological: Negative for tremors, syncope, speech difficulty, weakness, light-headedness and headaches.  Hematological: Does not bruise/bleed easily.  All other systems reviewed and are negative   Past Medical History:  Diagnosis Date  . Hypertension   . Renal disorder    Past Surgical History:  Procedure Laterality Date  . HERNIA REPAIR      Social History:  reports that he has never smoked. He has never used smokeless tobacco. He reports current alcohol use. He reports that he does not use drugs.   No Known Allergies  History reviewed. No pertinent family history.    Prior to Admission medications   Not on File    Physical Exam: BP 90/61   Pulse 88   Temp 98.4 F (36.9 C) (Oral)   Resp 18   Ht 5\' 8"  (1.727 m)   Wt 113.4 kg   SpO2 98%   BMI 38.01 kg/m   . General: 79 y.o. year-old male well developed well nourished in no acute distress.  Alert and oriented x3. Marland Kitchen HEENT: NCAT, EOMI . Neck: Supple, trachea medial . Cardiovascular: Tachycardia, irregularly irregular heart rhythm.  No rubs or gallops.  No thyromegaly or JVD noted.   Marland Kitchen Respiratory: Clear to auscultation with no wheezes or rales. Good inspiratory effort. . Abdomen: Soft nontender nondistended with normal bowel sounds x4 quadrants. . Muskuloskeletal: No cyanosis, positive for B/L lower extremity edema  . Neuro: CN II-XII intact, strength, sensation, reflexes . Skin: No  ulcerative lesions noted or rashes . Psychiatry: Judgement and insight appear normal. Mood is appropriate for condition and setting          Labs on Admission:  Basic Metabolic Panel: Recent Labs  Lab 04/15/20 2337  NA 136  K 4.1  CL 106  CO2 19*  GLUCOSE 117*  BUN 33*  CREATININE 1.72*  CALCIUM 8.8*   Liver Function Tests: Recent Labs  Lab 04/15/20 2337   AST 21  ALT 16  ALKPHOS 51  BILITOT 0.7  PROT 6.8  ALBUMIN 4.0   No results for input(s): LIPASE, AMYLASE in the last 168 hours. No results for input(s): AMMONIA in the last 168 hours. CBC: Recent Labs  Lab 04/15/20 2337  WBC 9.0  NEUTROABS 8.4*  HGB 14.3  HCT 43.8  MCV 95.0  PLT 234   Cardiac Enzymes: Recent Labs  Lab 04/15/20 2337  CKTOTAL 362    BNP (last 3 results) Recent Labs    04/15/20 2337  BNP 145.0*    ProBNP (last 3 results) No results for input(s): PROBNP in the last 8760 hours.  CBG: No results for input(s): GLUCAP in the last 168 hours.  Radiological Exams on Admission: DG Chest Portable 1 View  Result Date: 04/16/2020 CLINICAL DATA:  Shortness of breath EXAM: PORTABLE CHEST 1 VIEW COMPARISON:  None. FINDINGS: The heart size and mediastinal contours are within normal limits. Both lungs are clear. The visualized skeletal structures are unremarkable. IMPRESSION: No active disease. Electronically Signed   By: Donavan Foil M.D.   On: 04/16/2020 00:54   CT Renal Stone Study  Result Date: 04/16/2020 CLINICAL DATA:  79 year old with hematuria. EXAM: CT ABDOMEN AND PELVIS WITHOUT CONTRAST TECHNIQUE: Multidetector CT imaging of the abdomen and pelvis was performed following the standard protocol without IV contrast. COMPARISON:  Remote pelvis CT 11/01/2006 FINDINGS: Lower chest: Calcified granuloma in the right lower lobe. There is a tiny subpleural nodule in the right lower lobe, series 4, image 3. No pleural fluid or focal airspace disease. Hepatobiliary: Slight generalized increased hepatic density consistent with amiodarone therapy. No focal hepatic abnormality. Punctate calcified granuloma. Gallbladder physiologically distended, no calcified stone. No biliary dilatation. Pancreas: No ductal dilatation or inflammation. Mild fatty atrophy of the pancreatic head. Spleen: Normal in size without focal abnormality. Adrenals/Urinary Tract: 14 mm low-density nodule  in the right adrenal gland consistent with adenoma. The left adrenal gland is normal. There is bilateral renal parenchymal thinning. Symmetric bilateral perinephric edema. No hydronephrosis. No renal calculi. Suspect small cortical and parapelvic cysts in the left kidney. No evidence of solid renal lesion. Both ureters are decompressed without stones along the course. Urinary bladder is partially distended. There is no bladder wall thickening. No bladder stone. No obvious bladder mass. Stomach/Bowel: Ingested material in the stomach. Normal positioning of the duodenum and ligament of Treitz. There is no small bowel obstruction or inflammatory change. Mild fecalization of distal small bowel contents. Diminutive appendix tentatively visual. No appendicitis. Small to moderate colonic stool burden. Mild diverticulosis involving the descending and sigmoid colon. No diverticulitis. Vascular/Lymphatic: Aorto bi-iliac atherosclerosis. No aortic aneurysm. No enlarged lymph nodes in the abdomen or pelvis. No inguinal adenopathy. Reproductive: Enlarged prostate gland spanning 5.7 x 4.5 x 5.7 cm (volume = 77 cm^3). This causes mass effect on the bladder base. Other: Moderate to large right and small left fat containing inguinal hernia. No bowel involvement. No ascites. No free air. Musculoskeletal: Degenerative change in the lumbar spine and both hips. There are  no acute or suspicious osseous abnormalities. IMPRESSION: 1. No renal stones or obstructive uropathy. No explanation for hematuria. Consider elective pre and postcontrast hematuria protocol CT should symptoms persist. 2. Suspected small cortical and parapelvic cysts in the left kidney, not well assessed in the absence of IV contrast. 3. Enlarged prostate gland causing mass effect on the bladder base. Recommend correlation with PSA. 4. Moderate to large right and small left fat containing inguinal hernia. No bowel involvement. 5. Mild colonic diverticulosis without  diverticulitis. 6. Right adrenal adenoma. Aortic Atherosclerosis (ICD10-I70.0). Electronically Signed   By: Keith Rake M.D.   On: 04/16/2020 01:24    EKG: I independently viewed the EKG done and my findings are as followed: A. fib with RVR  Assessment/Plan Present on Admission: . Paroxysmal atrial fibrillation with RVR ([place])  Principal Problem:   Paroxysmal atrial fibrillation with RVR ([place]) Active Problems:   Hematuria   Acute lower UTI   Adenoma of right adrenal gland   BPH (benign prostatic hyperplasia)   Elevated brain natriuretic peptide (BNP) level   AKI (acute kidney injury) ([place])  Paroxysmal atrial fibrillation Patient was noted to be in A. fib with RVR on arrival at the ED, he was started on amiodarone drip Heparin drip cannot be started due to hematuria CHA2DS2- VASc score   is = 3  Which is  equal to = 3.2 % annual risk of stroke  Plan will be to transition patient to [place] in the morning if he continues to be in A. fib  UTI POA Hematuria in the setting of above Urinalysis was positive for hematuria and patient also complained of dysuria He was started on IV ceftriaxone, we shall continue with same at this time He denies history of tobacco use, however, it will be reasonable for patient to follow-up possibly with an outpatient urology if hematuria persists despite antibiotic treatment.  Hypotension BP dropped to 88/58, possibly due to hemodynamic effect IV hydration provided.  Elevated BNP BNP 145, patient denies chest pain or shortness of breath on exertion He has a history of hypertension and hyperlipidemia, however no medication was started on patient's med rec. No prior record of echocardiogram; echocardiogram will be done in the morning Continue total input/output, daily weights and fluid restriction Continue Cardiac diet   Acute kidney injury with no known history of CKD BUN to creatinine 33/1.72 (no prior labs for comparison) Renally adjust  medications, avoid nephrotoxic agents/dehydration/hypotension  Hypocalcemia Ca 8.8; continue Os-Cal  Right adrenal adenoma by CT abdomen and pelvis without contrast Stable  BPH CT abdomen and pelvis without contrast showed enlarged prostate gland causing mass-effect on the bladder base PSA will be checked per radiology recommendation  Obesity (BMI 38.01) Patient will be counseled on diet and exercise when more stable Patient will need to follow-up with outpatient PCP for weight loss program  DVT prophylaxis: SCDs (no indication for chemoprophylaxis at this time due to hematuria)  Code Status: Full code  Family Communication: None at bedside  Disposition Plan:  Patient is from:                        home Anticipated DC to:                   home Anticipated DC date:              24 hours Anticipated DC barriers:           Unstable to discharge  at this time due to paroxysmal A. fib currently on IV amiodarone drip.   Consults called: None  Admission status: Observation    Bernadette Hoit MD [facility] Pager [phone]  If 7PM-7AM, please contact night-coverage [email or web address] Password [facility]  04/16/2020, 4:59 AM

## 2020-04-16 NOTE — Progress Notes (Signed)
*  PRELIMINARY RESULTS* Echocardiogram 2D Echocardiogram has been performed.  Kevin Rios 04/16/2020, 9:46 AM

## 2020-04-16 NOTE — Progress Notes (Signed)
Amiodarone Drug - Drug Interaction Consult Note   Recommendations: Pt is not  on any contraindicated medications  at this time.   Amiodarone is metabolized by the cytochrome P450 system and therefore has the potential to cause many drug interactions. Amiodarone has an average plasma half-life of 50 days (range 20 to 100 days).    There is potential for drug interactions to occur several weeks or months after stopping treatment and the onset of drug interactions may be slow after initiating amiodarone.    []          Statins: Increased risk of myopathy. Simvastatin- restrict dose to 20mg  daily. Other statins: counsel patients to report any muscle pain or weakness immediately.   []          Anticoagulants: Amiodarone can increase anticoagulant effect. Consider warfarin dose reduction. Patients should be monitored closely and the dose of anticoagulant altered accordingly, remembering that amiodarone levels take several weeks to stabilize.   []          Antiepileptics: Amiodarone can increase plasma concentration of phenytoin, the dose should be reduced. Note that small changes in phenytoin dose can result in large changes in levels. Monitor patient and counsel on signs of toxicity.   []          Beta blockers: increased risk of bradycardia, AV block and myocardial depression. Sotalol - avoid concomitant use.   []          Calcium channel blockers (diltiazem and verapamil): increased risk of bradycardia, AV block and myocardial depression.   []          Cyclosporine: Amiodarone increases levels of cyclosporine. Reduced dose of cyclosporine is recommended.   []          Digoxin dose should be halved when amiodarone is started.   []          Diuretics: increased risk of cardiotoxicity if hypokalemia occurs.   []          Oral hypoglycemic agents (glyburide, glipizide, glimepiride): increased risk of hypoglycemia. Patient's glucose levels should be monitored closely when initiating amiodarone therapy.     []          Drugs that prolong the QT interval:  Torsades de pointes risk may be increased with concurrent use - avoid if possible.  Monitor QTc, also keep magnesium/potassium WNL if concurrent therapy can't be avoided. Marland Kitchen Antibiotics: e.g. fluoroquinolones, erythromycin. . Antiarrhythmics: e.g. quinidine, procainamide, disopyramide, sotalol. . Antipsychotics: e.g. phenothiazines, haloperidol.             . Lithium, tricyclic antidepressants, and methadone.  Despina Pole, Pharm. D. Clinical Pharmacist 04/16/2020 12:32 PM

## 2020-04-16 NOTE — Progress Notes (Signed)
Patient admitted this morning for hematuria/urinary tract infection complicated by atrial fibrillation with RVR.  Started on IV Rocephin.  Was also started on IV amiodarone which made him hypotensive therefore was discontinued.  During my evaluation his heart rate was fluctuating between 65-85, irregular.  Minimal bibasilar crackles, abdomen is nontender nondistended.  At this time continue IV Rocephin, monitor culture data and adjust as necessary Can use low-dose beta-blocker for better heart rate control, no need for anticoagulation until hematuria clears.  This can be addressed once hematuria has better. Echocardiogram has been ordered Appreciate input from cardiology team  Call with further questions as needed  Med rec will need to be completed once updated by pharmacy  Time spent-15 minutes  Gerlean Ren MD [facility]

## 2020-04-17 DIAGNOSIS — D3501 Benign neoplasm of right adrenal gland: Secondary | ICD-10-CM | POA: Diagnosis not present

## 2020-04-17 DIAGNOSIS — I4819 Other persistent atrial fibrillation: Secondary | ICD-10-CM | POA: Diagnosis not present

## 2020-04-17 DIAGNOSIS — R600 Localized edema: Secondary | ICD-10-CM | POA: Diagnosis not present

## 2020-04-17 DIAGNOSIS — I429 Cardiomyopathy, unspecified: Secondary | ICD-10-CM

## 2020-04-17 DIAGNOSIS — N390 Urinary tract infection, site not specified: Secondary | ICD-10-CM | POA: Diagnosis not present

## 2020-04-17 DIAGNOSIS — I480 Paroxysmal atrial fibrillation: Secondary | ICD-10-CM | POA: Diagnosis not present

## 2020-04-17 LAB — COMPREHENSIVE METABOLIC PANEL
ALT: 14 U/L (ref 0–44)
AST: 16 U/L (ref 15–41)
Albumin: 3.5 g/dL (ref 3.5–5.0)
Alkaline Phosphatase: 43 U/L (ref 38–126)
Anion gap: 8 (ref 5–15)
BUN: 21 mg/dL (ref 8–23)
CO2: 21 mmol/L — ABNORMAL LOW (ref 22–32)
Calcium: 8.3 mg/dL — ABNORMAL LOW (ref 8.9–10.3)
Chloride: 109 mmol/L (ref 98–111)
Creatinine, Ser: 1.13 mg/dL (ref 0.61–1.24)
GFR calc Af Amer: >60 mL/min (ref >60)
GFR calc non Af Amer: >60 mL/min (ref >60)
Glucose, Bld: 109 mg/dL — ABNORMAL HIGH (ref 70–99)
Potassium: 4 mmol/L (ref 3.5–5.1)
Sodium: 138 mmol/L (ref 135–145)
Total Bilirubin: 0.3 mg/dL (ref 0.3–1.2)
Total Protein: 6.1 g/dL — ABNORMAL LOW (ref 6.5–8.1)

## 2020-04-17 LAB — PROTIME-INR
INR: 1.1 (ref 0.8–1.2)
Prothrombin Time: 14.2 seconds (ref 11.4–15.2)

## 2020-04-17 LAB — CBC
HCT: 41.8 % (ref 39.0–52.0)
Hemoglobin: 13.4 g/dL (ref 13.0–17.0)
MCH: 30.4 pg (ref 26.0–34.0)
MCHC: 32.1 g/dL (ref 30.0–36.0)
MCV: 94.8 fL (ref 80.0–100.0)
Platelets: 215 10*3/uL (ref 150–400)
RBC: 4.41 MIL/uL (ref 4.22–5.81)
RDW: 13.2 % (ref 11.5–15.5)
WBC: 4.8 10*3/uL (ref 4.0–10.5)
nRBC: 0 % (ref 0.0–0.2)

## 2020-04-17 LAB — APTT: aPTT: 27 seconds (ref 24–36)

## 2020-04-17 LAB — MAGNESIUM: Magnesium: 2.3 mg/dL (ref 1.7–2.4)

## 2020-04-17 LAB — URINE CULTURE: Culture: NO GROWTH

## 2020-04-17 LAB — TSH: TSH: 3.211 u[IU]/mL (ref 0.350–4.500)

## 2020-04-17 MED ORDER — METOPROLOL SUCCINATE ER 25 MG PO TB24: 25.0000 mg | ORAL_TABLET | Freq: Every day | ORAL | 0 refills | Status: DC

## 2020-04-17 MED ORDER — METOPROLOL SUCCINATE ER 25 MG PO TB24: 25.0000 mg | ORAL_TABLET | Freq: Every day | ORAL | Status: DC

## 2020-04-17 MED ORDER — TAMSULOSIN HCL 0.4 MG PO CAPS: 0.4000 mg | ORAL_CAPSULE | Freq: Every day | ORAL | 0 refills | Status: DC

## 2020-04-17 MED ORDER — CEPHALEXIN 500 MG PO CAPS: 500.0000 mg | ORAL_CAPSULE | Freq: Four times a day (QID) | ORAL | 0 refills | Status: AC

## 2020-04-17 NOTE — Care Management Obs Status (Signed)
[facility] NOTIFICATION   Patient Details  Name: Kevin Rios MRN: [id number] Date of Birth: 1941/08/06   Medicare Observation Status Notification Given:  Yes    Tommy Medal 04/17/2020, 12:02 PM

## 2020-04-17 NOTE — Progress Notes (Signed)
Nsg Discharge Note  Admit Date:  04/15/2020 Discharge date: 04/17/2020   Kevin Rios to be D/C'd Home  per MD order.  AVS completed.  Copy for chart, and copy for patient signed, and dated. Patient/caregiver able to verbalize understanding.  Discharge Medication: Allergies as of 04/17/2020   No Known Allergies     Medication List    STOP taking these medications   amLODipine-olmesartan 10-40 MG tablet Commonly known as: AZOR     TAKE these medications   acetaminophen 500 MG tablet Commonly known as: TYLENOL Take 1,000 mg by mouth daily.   aspirin EC 81 MG tablet Take 81 mg by mouth at bedtime. Swallow whole.   cephALEXin 500 MG capsule Commonly known as: KEFLEX Take 1 capsule (500 mg total) by mouth 4 (four) times daily for 3 days.   furosemide 20 MG tablet Commonly known as: LASIX Take 20 mg by mouth every other day.   meloxicam 7.5 MG tablet Commonly known as: MOBIC Take 7.5 mg by mouth daily.   metoprolol succinate 25 MG 24 hr tablet Commonly known as: TOPROL-XL Take 1 tablet (25 mg total) by mouth daily.   pravastatin 20 MG tablet Commonly known as: PRAVACHOL Take 20 mg by mouth at bedtime.   tamsulosin 0.4 MG Caps capsule Commonly known as: FLOMAX Take 1 capsule (0.4 mg total) by mouth daily after supper.       Discharge Assessment: Vitals:   04/17/20 0500 04/17/20 1317  BP: 110/60 129/61  Pulse: 90 (!) 59  Resp:  20  Temp: 98.5 F (36.9 C) 98.6 F (37 C)  SpO2: 94% 98%   Skin clean, dry and intact without evidence of skin break down, no evidence of skin tears noted. IV catheter discontinued intact. Site without signs and symptoms of complications - no redness or edema noted at insertion site, patient denies c/o pain - only slight tenderness at site.  Dressing with slight pressure applied.  D/c Instructions-Education: Discharge instructions given to patient/family with verbalized understanding. D/c education completed with patient/family  including follow up instructions, medication list, d/c activities limitations if indicated, with other d/c instructions as indicated by MD - patient able to verbalize understanding, all questions fully answered. Patient instructed to return to ED, call 911, or call MD for any changes in condition.  Patient escorted via [place], and D/C home via private auto.  Dorcas Mcmurray, LPN 3/61/2244 9:75 PM

## 2020-04-17 NOTE — Progress Notes (Addendum)
Progress Note  Patient Name: EVEN BUDLONG Date of Encounter: 04/17/2020  Primary Cardiologist: Rozann Lesches, MD   Subjective   No chest pain or palpitations. Breathing at baseline. Unaware of any recurrent hematuria.   Inpatient Medications    Scheduled Meds: . calcium carbonate  1 tablet Oral Q breakfast  . metoprolol succinate  25 mg Oral Daily  . tamsulosin  0.4 mg Oral QPC supper   Continuous Infusions: . amiodarone Stopped (04/16/20 0557)  . cefTRIAXone (ROCEPHIN)  IV 1 g (04/17/20 0215)   PRN Meds: acetaminophen, metoprolol tartrate, polyethylene glycol, senna-docusate   Vital Signs    Vitals:   04/16/20 1931 04/16/20 2042 04/17/20 0220 04/17/20 0500  BP:  108/74 122/65 110/60  Pulse:  91 69 90  Resp:      Temp:  98.5 F (36.9 C) 97.8 F (36.6 C) 98.5 F (36.9 C)  TempSrc:  Oral Oral   SpO2: 92% 94% 96% 94%  Weight:      Height:        Intake/Output Summary (Last 24 hours) at 04/17/2020 1030 Last data filed at 04/17/2020 4818 Gross per 24 hour  Intake 1873.95 ml  Output 475 ml  Net 1398.95 ml    Last 3 Weights 04/15/2020  Weight (lbs) 250 lb  Weight (kg) 113.399 kg      Telemetry    Atrial fibrillation, HR variable from 60's to 90's. Nocturnal pauses up to 2.25 seconds.  - Personally Reviewed  ECG    Rate-controlled atrial fibrillation, HR 76. - Personally Reviewed  Physical Exam   General: Well developed, elderly male appearing in no acute distress. Head: Normocephalic, atraumatic.  Neck: Supple without bruits, JVD. Lungs:  Resp regular and unlabored, CTA without wheezing or rales. Heart: Irregularly irregularly, S1, S2, no S3, S4, or murmur; no rub. Abdomen: Soft, non-tender, non-distended with normoactive bowel sounds. No hepatomegaly. No rebound/guarding. No obvious abdominal masses. Extremities: No clubbing or cyanosis, trace lower extremity edema. Distal pedal pulses are 2+ bilaterally. Neuro: Alert and oriented X 3. Moves  all extremities spontaneously. Psych: Normal affect.  Labs    Chemistry Recent Labs  Lab 04/15/20 2337 04/17/20 0408  NA 136 138  K 4.1 4.0  CL 106 109  CO2 19* 21*  GLUCOSE 117* 109*  BUN 33* 21  CREATININE 1.72* 1.13  CALCIUM 8.8* 8.3*  PROT 6.8 6.1*  ALBUMIN 4.0 3.5  AST 21 16  ALT 16 14  ALKPHOS 51 43  BILITOT 0.7 0.3  GFRNONAA 38* >60  GFRAA 44* >60  ANIONGAP 11 8     Hematology Recent Labs  Lab 04/15/20 2337 04/17/20 0408  WBC 9.0 4.8  RBC 4.61 4.41  HGB 14.3 13.4  HCT 43.8 41.8  MCV 95.0 94.8  MCH 31.0 30.4  MCHC 32.6 32.1  RDW 13.5 13.2  PLT 234 215    Cardiac EnzymesNo results for input(s): TROPONINI in the last 168 hours. No results for input(s): TROPIPOC in the last 168 hours.   BNP Recent Labs  Lab 04/15/20 2337  BNP 145.0*     DDimer No results for input(s): DDIMER in the last 168 hours.   Radiology    DG Chest Portable 1 View  Result Date: 04/16/2020 CLINICAL DATA:  Shortness of breath EXAM: PORTABLE CHEST 1 VIEW COMPARISON:  None. FINDINGS: The heart size and mediastinal contours are within normal limits. Both lungs are clear. The visualized skeletal structures are unremarkable. IMPRESSION: No active disease. Electronically Signed   By:  Donavan Foil M.D.   On: 04/16/2020 00:54    CT Renal Stone Study  Result Date: 04/16/2020 CLINICAL DATA:  79 year old with hematuria. EXAM: CT ABDOMEN AND PELVIS WITHOUT CONTRAST TECHNIQUE: Multidetector CT imaging of the abdomen and pelvis was performed following the standard protocol without IV contrast. COMPARISON:  Remote pelvis CT 11/01/2006 FINDINGS: Lower chest: Calcified granuloma in the right lower lobe. There is a tiny subpleural nodule in the right lower lobe, series 4, image 3. No pleural fluid or focal airspace disease. Hepatobiliary: Slight generalized increased hepatic density consistent with amiodarone therapy. No focal hepatic abnormality. Punctate calcified granuloma. Gallbladder  physiologically distended, no calcified stone. No biliary dilatation. Pancreas: No ductal dilatation or inflammation. Mild fatty atrophy of the pancreatic head. Spleen: Normal in size without focal abnormality. Adrenals/Urinary Tract: 14 mm low-density nodule in the right adrenal gland consistent with adenoma. The left adrenal gland is normal. There is bilateral renal parenchymal thinning. Symmetric bilateral perinephric edema. No hydronephrosis. No renal calculi. Suspect small cortical and parapelvic cysts in the left kidney. No evidence of solid renal lesion. Both ureters are decompressed without stones along the course. Urinary bladder is partially distended. There is no bladder wall thickening. No bladder stone. No obvious bladder mass. Stomach/Bowel: Ingested material in the stomach. Normal positioning of the duodenum and ligament of Treitz. There is no small bowel obstruction or inflammatory change. Mild fecalization of distal small bowel contents. Diminutive appendix tentatively visual. No appendicitis. Small to moderate colonic stool burden. Mild diverticulosis involving the descending and sigmoid colon. No diverticulitis. Vascular/Lymphatic: Aorto bi-iliac atherosclerosis. No aortic aneurysm. No enlarged lymph nodes in the abdomen or pelvis. No inguinal adenopathy. Reproductive: Enlarged prostate gland spanning 5.7 x 4.5 x 5.7 cm (volume = 77 cm^3). This causes mass effect on the bladder base. Other: Moderate to large right and small left fat containing inguinal hernia. No bowel involvement. No ascites. No free air. Musculoskeletal: Degenerative change in the lumbar spine and both hips. There are no acute or suspicious osseous abnormalities. IMPRESSION: 1. No renal stones or obstructive uropathy. No explanation for hematuria. Consider elective pre and postcontrast hematuria protocol CT should symptoms persist. 2. Suspected small cortical and parapelvic cysts in the left kidney, not well assessed in the  absence of IV contrast. 3. Enlarged prostate gland causing mass effect on the bladder base. Recommend correlation with PSA. 4. Moderate to large right and small left fat containing inguinal hernia. No bowel involvement. 5. Mild colonic diverticulosis without diverticulitis. 6. Right adrenal adenoma. Aortic Atherosclerosis (ICD10-I70.0). Electronically Signed   By: Keith Rake M.D.   On: 04/16/2020 01:24    Cardiac Studies   Echocardiogram: 04/16/2020 IMPRESSIONS    1. Left ventricular ejection fraction, by estimation, is 40 to 45%. The  left ventricle has mildly decreased function. The left ventricle  demonstrates global hypokinesis. There is mild left ventricular  hypertrophy. Left ventricular diastolic parameters  are indeterminate in the setting of atrial fibrillation.  2. Right ventricular systolic function is moderately reduced. The right  ventricular size is normal. There is normal pulmonary artery systolic  pressure. The estimated right ventricular systolic pressure is 46.5 mmHg.  3. Left atrial size was mildly dilated.  4. The mitral valve is grossly normal. Trivial mitral valve  regurgitation.  5. The aortic valve is tricuspid. Aortic valve regurgitation is not  visualized.  6. The inferior vena cava is normal in size with greater than 50%  respiratory variability, suggesting right atrial pressure of 3 mmHg.  Patient Profile     79 y.o. male w/PMH of HTN and family history of CAD who presented to [facility] ED on 04/15/2020 for evaluation of hematuria. Was found to be in newly diagnosed atrial fibrillation with RVR.   Assessment & Plan    1. New-Onset Atrial Fibrillation - Initially presented with hematuria but was found to be in atrial fibrillation and reported feeling "weird" for the past few days. TSH and electrolytes WNL.  - Was initially placed on IV Amiodarone which has since been discontinued. Prior notes mentioned starting a low-dose BB but this has not  yet been initiated. Given his cardiomyopathy, will start Toprol-XL 25mg  daily. - This patients CHA2DS2-VASc Score and unadjusted Ischemic Stroke Rate (% per year) is equal to 4.8 % stroke rate/year from a score of 4 (CHF, HTN, Age (2)). He has not yet been started on anticoagulation in the setting of his hematuria. Reports his PCP had him on a blood thinner in the past but he is unsure why this was initiated. Would plan for a rate-control strategy initially given his hematuria and unsure if he will tolerate uninterrupted anticoagulation.   2. New Cardiomyopathy - Echocardiogram this admission shows his EF is reduced at 40-45% with global HK and RV function is moderately reduced as well. Likely tachycardia-mediated due to his atrial fibrillation.  - Will start Toprol-XL. Plan to resume ARB prior to discharge if BP allows.   3. UTI/AKI - Creatinine initially elevated to 1.72, improved to 1.13 today. He has been started on Rocephin by the admitting team for his UTI. Denies any recurrent hematuria.    4. HTN - BP has variable from 108/60 - 162/92 within the past 24 hours. Was listed as being on Lasix 20mg  every other day and Amlodipine-Olmesartan 10-40mg  daily prior to admission. Would hold Amlodipine-Olmesartan for now and start Toprol-XL as outlined above. Would plan to resume ARB as an outpatient if BP allows.   For questions or updates, please contact [place] Please consult [email or web address] for contact info under Cardiology/STEMI.   Signed, Erma Heritage , PA-C 10:30 AM 04/17/2020 Pager: [phone]   Attending note:  Patient seen and examined.  Reviewed interval hospital course and case discussed with Ms. Ahmed Prima PA-C.  Patient reports no sense of palpitations or chest discomfort.  He remains in atrial fibrillation with adequate rate control.  Systolic blood pressure ranging 110-120.  I personally reviewed his ECG from earlier today showing atrial fibrillation at 76 bpm, low  voltage and decreased R wave progression.  Echocardiogram from yesterday revealed LVEF 40 to 45% range with global hypokinesis, also moderate RV dysfunction.  Question possible nonischemic, tachycardia-mediated cardiomyopathy.  CHA2DS2-VASc score is 4.  Would not initiate oral anticoagulation until hematuria has been treated and resolved.  This can potentially be initiated as an outpatient.  Agree with starting Toprol-XL in light of atrial fibrillation and cardiomyopathy.  Depending on his blood pressure, might be able to add back a low-dose ARB eventually as well.  We will arrange office follow-up after discharge.  Satira Sark, M.D., F.A.C.C.

## 2020-04-17 NOTE — Discharge Summary (Signed)
Physician Discharge Summary  YOSGART PAVEY VHQ:[id number] DOB: 24-Apr-1941 DOA: 04/15/2020  PCP: Celene Squibb, MD  Admit date: 04/15/2020 Discharge date: 04/17/2020  Admitted From: Home Disposition: Home  Recommendations for Outpatient Follow-up:  1. Follow up with PCP in 1-2 weeks 2. Please obtain BMP/CBC in one week your next doctors visit.  3. Oral Keflex for 3 more days 4. Toprol-XL daily prescribed. Hold off on ARB at this time, can be slowly resumed outpatient 5. Flomax daily 6. Outpatient follow-up with urology, referral given 7. Outpatient follow-up with cardiology   Discharge Condition: Stable CODE STATUS: Full code Diet recommendation: Heart healthy  Brief/Interim Summary: 79 y.o. male with medical history significant for hypertension and hypercholesterolemia who presents to the emergency department due to blood in urine. Diagnosed with urinary tract infection, hospital course complicated by atrial fibrillation with RVR. CT renal study was negative for any acute pathology. With hydration and IV antibiotics his hematuria resolved. He was also started on Flomax due to enlarged prostate. For A. fib with RVR initially placed on amiodarone drip which made him hypotensive, cardiology was consulted. His heart rate was controlled with beta-blocker, his ARB was discontinued for now. Echocardiogram showed EF of 45%. The following day his hematuria resolved he was urinating well without any issues. His heart rate remained stable and was cleared by cardiology for discharge with outpatient follow-up recommendations as stated above.  Body mass index is 38.01 kg/m.    Urinary tract infection, prior to admission -Urine cultures are not available at the time of discharge. Will transition IV Rocephin to 3 more days of oral Keflex.  Hematuria BPH -Hematuria has resolved. Suspect this is secondary to urinary tract infection. We will start him on Flomax. Renal CT is negative for any acute  pathology except enlarged prostate. Outpatient ambulatory referral for urology has been given.  Atrial fibrillation with RVR, improved -Due to ongoing and recent hematuria currently holding off on anticoagulation but if this remains stable anticoagulation will be started outpatient. Seen by cardiology team in the hospital. Started on Toprol-XL.  Congestive heart failure with reduced ejection fraction, 45% -Currently appears to be euvolemic in nature. Cardiology started patient on Toprol-XL with outpatient follow-up. Currently ARB is on hold until blood pressure remained stable and can be slowly resumed outpatient.  Right-sided renal adenoma -Stable follow-up outpatient.     Discharge Diagnoses:  Principal Problem:   Paroxysmal atrial fibrillation with RVR (HCC) Active Problems:   Hematuria   Acute lower UTI   Adenoma of right adrenal gland   BPH (benign prostatic hyperplasia)   Elevated brain natriuretic peptide (BNP) level   AKI (acute kidney injury) ([place])   Hypocalcemia   Obesity      Consultations:  Cardiology  Subjective: Patient feels great, ambulating in the room without any issues. Wants to go home today.  Discharge Exam: Vitals:   04/17/20 0220 04/17/20 0500  BP: 122/65 110/60  Pulse: 69 90  Resp:    Temp: 97.8 F (36.6 C) 98.5 F (36.9 C)  SpO2: 96% 94%   Vitals:   04/16/20 1931 04/16/20 2042 04/17/20 0220 04/17/20 0500  BP:  108/74 122/65 110/60  Pulse:  91 69 90  Resp:      Temp:  98.5 F (36.9 C) 97.8 F (36.6 C) 98.5 F (36.9 C)  TempSrc:  Oral Oral   SpO2: 92% 94% 96% 94%  Weight:      Height:        General: Pt is alert,  awake, not in acute distress Cardiovascular: RRR, S1/S2 +, no rubs, no gallops Respiratory: CTA bilaterally, no wheezing, no rhonchi Abdominal: Soft, NT, ND, bowel sounds + Extremities: no edema, no cyanosis  Discharge Instructions  Discharge Instructions    Ambulatory referral to Urology   Complete by: As  directed    Discharge patient   Complete by: As directed    Discharge disposition: 01-Home or Self Care   Discharge patient date: 04/17/2020     Allergies as of 04/17/2020   No Known Allergies     Medication List    STOP taking these medications   amLODipine-olmesartan 10-40 MG tablet Commonly known as: AZOR     TAKE these medications   acetaminophen 500 MG tablet Commonly known as: TYLENOL Take 1,000 mg by mouth daily.   aspirin EC 81 MG tablet Take 81 mg by mouth at bedtime. Swallow whole.   cephALEXin 500 MG capsule Commonly known as: KEFLEX Take 1 capsule (500 mg total) by mouth 4 (four) times daily for 3 days.   furosemide 20 MG tablet Commonly known as: LASIX Take 20 mg by mouth every other day.   meloxicam 7.5 MG tablet Commonly known as: MOBIC Take 7.5 mg by mouth daily.   metoprolol succinate 25 MG 24 hr tablet Commonly known as: TOPROL-XL Take 1 tablet (25 mg total) by mouth daily.   pravastatin 20 MG tablet Commonly known as: PRAVACHOL Take 20 mg by mouth at bedtime.   tamsulosin 0.4 MG Caps capsule Commonly known as: FLOMAX Take 1 capsule (0.4 mg total) by mouth daily after supper.       Follow-up Information    Verta Ellen., NP Follow up on 04/30/2020.   Specialty: Cardiology Why: Cardiology Hospital Follow-up on 04/30/2020 at 8:30 AM.  Contact information: [place] [address] [phone]        Celene Squibb, MD. Schedule an appointment as soon as possible for a visit in 1 week(s).   Specialty: Internal Medicine Contact information: [facility] [address] [phone]        Satira Sark, MD .   Specialty: Cardiology Contact information: [place] [address] [phone]              No Known Allergies  You were cared for by a hospitalist during your hospital stay. If you have any questions about your discharge medications or the care you  received while you were in the hospital after you are discharged, you can call the unit and asked to speak with the hospitalist on call if the hospitalist that took care of you is not available. Once you are discharged, your primary care physician will handle any further medical issues. Please note that no refills for any discharge medications will be authorized once you are discharged, as it is imperative that you return to your primary care physician (or establish a relationship with a primary care physician if you do not have one) for your aftercare needs so that they can reassess your need for medications and monitor your lab values.   Procedures/Studies: DG Chest Portable 1 View  Result Date: 04/16/2020 CLINICAL DATA:  Shortness of breath EXAM: PORTABLE CHEST 1 VIEW COMPARISON:  None. FINDINGS: The heart size and mediastinal contours are within normal limits. Both lungs are clear. The visualized skeletal structures are unremarkable. IMPRESSION: No active disease. Electronically Signed   By: Madie Reno.D.  On: 04/16/2020 00:54   ECHOCARDIOGRAM COMPLETE  Result Date: 04/16/2020    ECHOCARDIOGRAM REPORT   Patient Name:   MONTEZ CUDA Trabucco Date of Exam: 04/16/2020 Medical Rec #:  [id number]         Height:       68.0 in Accession #:    [id number]        Weight:       250.0 lb Date of Birth:  1941-08-27         BSA:          2.247 m Patient Age:    34 years          BP:           102/68 mmHg Patient Gender: M                 HR:           68 bpm. Exam Location:  [facility] Procedure: 2D Echo Indications:    CHF-Acute Diastolic 876.81 / L57.26  History:        Patient has no prior history of Echocardiogram examinations.                 Arrythmias:Atrial Fibrillation; Risk Factors:Non-Smoker. AkI,                 Elevated BNP.  Sonographer:    Leavy Cella RDCS (AE) Referring Phys: [id number] OLADAPO ADEFESO IMPRESSIONS  1. Left ventricular ejection fraction, by estimation, is 40 to 45%. The left  ventricle has mildly decreased function. The left ventricle demonstrates global hypokinesis. There is mild left ventricular hypertrophy. Left ventricular diastolic parameters are indeterminate in the setting of atrial fibrillation.  2. Right ventricular systolic function is moderately reduced. The right ventricular size is normal. There is normal pulmonary artery systolic pressure. The estimated right ventricular systolic pressure is 41.6 mmHg.  3. Left atrial size was mildly dilated.  4. The mitral valve is grossly normal. Trivial mitral valve regurgitation.  5. The aortic valve is tricuspid. Aortic valve regurgitation is not visualized.  6. The inferior vena cava is normal in size with greater than 50% respiratory variability, suggesting right atrial pressure of 3 mmHg. FINDINGS  Left Ventricle: Left ventricular ejection fraction, by estimation, is 40 to 45%. The left ventricle has mildly decreased function. The left ventricle demonstrates global hypokinesis. The left ventricular internal cavity size was normal in size. There is  mild left ventricular hypertrophy. Left ventricular diastolic parameters are indeterminate. Right Ventricle: The right ventricular size is normal. No increase in right ventricular wall thickness. Right ventricular systolic function is moderately reduced. There is normal pulmonary artery systolic pressure. The tricuspid regurgitant velocity is 2.52 m/s, and with an assumed right atrial pressure of 3 mmHg, the estimated right ventricular systolic pressure is 38.4 mmHg. Left Atrium: Left atrial size was mildly dilated. Right Atrium: Right atrial size was normal in size. Pericardium: There is no evidence of pericardial effusion. Mitral Valve: The mitral valve is grossly normal. There is mild thickening of the mitral valve leaflet(s). Mild mitral annular calcification. Trivial mitral valve regurgitation. Tricuspid Valve: The tricuspid valve is grossly normal. Tricuspid valve regurgitation is  mild. Aortic Valve: The aortic valve is tricuspid. Aortic valve regurgitation is not visualized. Mild to moderate aortic valve annular calcification. Pulmonic Valve: The pulmonic valve was grossly normal. Pulmonic valve regurgitation is trivial. Aorta: The aortic root is normal in size and structure. Venous: The inferior vena cava is normal in size with  greater than 50% respiratory variability, suggesting right atrial pressure of 3 mmHg. IAS/Shunts: No atrial level shunt detected by color flow Doppler.  LEFT VENTRICLE PLAX 2D LVIDd:         3.76 cm  Diastology LVIDs:         3.06 cm  LV e' lateral:   8.93 cm/s LV PW:         1.32 cm  LV E/e' lateral: 9.9 LV IVS:        1.36 cm  LV e' medial:    6.08 cm/s LVOT diam:     2.00 cm  LV E/e' medial:  14.6 LVOT Area:     3.14 cm  RIGHT VENTRICLE RV S prime:     8.55 cm/s TAPSE (M-mode): 1.4 cm LEFT ATRIUM             Index       RIGHT ATRIUM           Index LA diam:        4.80 cm 2.14 cm/m  RA Area:     16.70 cm LA Vol (A2C):   82.6 ml 36.76 ml/m RA Volume:   42.30 ml  18.82 ml/m LA Vol (A4C):   75.3 ml 33.51 ml/m LA Biplane Vol: 84.2 ml 37.47 ml/m   AORTA Ao Root diam: 2.90 cm MITRAL VALVE               TRICUSPID VALVE MV Area (PHT): 5.35 cm    TR Peak grad:   25.4 mmHg MV Decel Time: 142 msec    TR Vmax:        252.00 cm/s MV E velocity: 88.70 cm/s                            SHUNTS                            Systemic Diam: 2.00 cm Rozann Lesches MD Electronically signed by Rozann Lesches MD Signature Date/Time: 04/16/2020/1:16:09 PM    Final    CT Renal Stone Study  Result Date: 04/16/2020 CLINICAL DATA:  79 year old with hematuria. EXAM: CT ABDOMEN AND PELVIS WITHOUT CONTRAST TECHNIQUE: Multidetector CT imaging of the abdomen and pelvis was performed following the standard protocol without IV contrast. COMPARISON:  Remote pelvis CT 11/01/2006 FINDINGS: Lower chest: Calcified granuloma in the right lower lobe. There is a tiny subpleural nodule in the right  lower lobe, series 4, image 3. No pleural fluid or focal airspace disease. Hepatobiliary: Slight generalized increased hepatic density consistent with amiodarone therapy. No focal hepatic abnormality. Punctate calcified granuloma. Gallbladder physiologically distended, no calcified stone. No biliary dilatation. Pancreas: No ductal dilatation or inflammation. Mild fatty atrophy of the pancreatic head. Spleen: Normal in size without focal abnormality. Adrenals/Urinary Tract: 14 mm low-density nodule in the right adrenal gland consistent with adenoma. The left adrenal gland is normal. There is bilateral renal parenchymal thinning. Symmetric bilateral perinephric edema. No hydronephrosis. No renal calculi. Suspect small cortical and parapelvic cysts in the left kidney. No evidence of solid renal lesion. Both ureters are decompressed without stones along the course. Urinary bladder is partially distended. There is no bladder wall thickening. No bladder stone. No obvious bladder mass. Stomach/Bowel: Ingested material in the stomach. Normal positioning of the duodenum and ligament of Treitz. There is no small bowel obstruction or inflammatory change. Mild fecalization of distal small bowel  contents. Diminutive appendix tentatively visual. No appendicitis. Small to moderate colonic stool burden. Mild diverticulosis involving the descending and sigmoid colon. No diverticulitis. Vascular/Lymphatic: Aorto bi-iliac atherosclerosis. No aortic aneurysm. No enlarged lymph nodes in the abdomen or pelvis. No inguinal adenopathy. Reproductive: Enlarged prostate gland spanning 5.7 x 4.5 x 5.7 cm (volume = 77 cm^3). This causes mass effect on the bladder base. Other: Moderate to large right and small left fat containing inguinal hernia. No bowel involvement. No ascites. No free air. Musculoskeletal: Degenerative change in the lumbar spine and both hips. There are no acute or suspicious osseous abnormalities. IMPRESSION: 1. No renal  stones or obstructive uropathy. No explanation for hematuria. Consider elective pre and postcontrast hematuria protocol CT should symptoms persist. 2. Suspected small cortical and parapelvic cysts in the left kidney, not well assessed in the absence of IV contrast. 3. Enlarged prostate gland causing mass effect on the bladder base. Recommend correlation with PSA. 4. Moderate to large right and small left fat containing inguinal hernia. No bowel involvement. 5. Mild colonic diverticulosis without diverticulitis. 6. Right adrenal adenoma. Aortic Atherosclerosis (ICD10-I70.0). Electronically Signed   By: Keith Rake M.D.   On: 04/16/2020 01:24      The results of significant diagnostics from this hospitalization (including imaging, microbiology, ancillary and laboratory) are listed below for reference.     Microbiology: Recent Results (from the past 240 hour(s))  SARS Coronavirus 2 by RT PCR (hospital order, performed in [facility] hospital lab) Nasopharyngeal Nasopharyngeal Swab     Status: None   Collection Time: 04/16/20  1:45 AM   Specimen: Nasopharyngeal Swab  Result Value Ref Range Status   SARS Coronavirus 2 NEGATIVE NEGATIVE Final    Comment: (NOTE) SARS-CoV-2 target nucleic acids are NOT DETECTED.  The SARS-CoV-2 RNA is generally detectable in upper and lower respiratory specimens during the acute phase of infection. The lowest concentration of SARS-CoV-2 viral copies this assay can detect is 250 copies / mL. A negative result does not preclude SARS-CoV-2 infection and should not be used as the sole basis for treatment or other patient management decisions.  A negative result may occur with improper specimen collection / handling, submission of specimen other than nasopharyngeal swab, presence of viral mutation(s) within the areas targeted by this assay, and inadequate number of viral copies (<250 copies / mL). A negative result must be combined with clinical observations,  patient history, and epidemiological information.  Fact Sheet for Patients:   StrictlyIdeas.no  Fact Sheet for Healthcare Providers: BankingDealers.co.za  This test is not yet approved or  cleared by the [place] FDA and has been authorized for detection and/or diagnosis of SARS-CoV-2 by FDA under an Emergency Use Authorization (EUA).  This EUA will remain in effect (meaning this test can be used) for the duration of the COVID-19 declaration under Section 564(b)(1) of the Act, 21 U.S.C. section 360bbb-3(b)(1), unless the authorization is terminated or revoked sooner.  Performed at [facility], [address], [place], [place] [address]   Urine Culture     Status: None   Collection Time: 04/16/20  2:09 AM   Specimen: Urine, Catheterized  Result Value Ref Range Status   Specimen Description   Final    URINE, CATHETERIZED Performed at [facility], [address] [place], [place] [address]    Special Requests   Final    NONE Performed at [facility], [address], [place], [place] [address]    Culture   Final  NO GROWTH Performed at [facility], [place] [address], [place], [place] [address]    Report Status 04/17/2020 FINAL  Final     Labs: BNP (last 3 results) Recent Labs    04/15/20 2337  BNP 580.9*   Basic Metabolic Panel: Recent Labs  Lab 04/15/20 2337 04/17/20 0408  NA 136 138  K 4.1 4.0  CL 106 109  CO2 19* 21*  GLUCOSE 117* 109*  BUN 33* 21  CREATININE 1.72* 1.13  CALCIUM 8.8* 8.3*  MG  --  2.3   Liver Function Tests: Recent Labs  Lab 04/15/20 2337 04/17/20 0408  AST 21 16  ALT 16 14  ALKPHOS 51 43  BILITOT 0.7 0.3  PROT 6.8 6.1*  ALBUMIN 4.0 3.5   No results for input(s): LIPASE, AMYLASE in the last 168 hours. No results for input(s): AMMONIA in the last 168 hours. CBC: Recent Labs  Lab 04/15/20 2337 04/17/20 0408  WBC 9.0 4.8  NEUTROABS 8.4*  --   HGB 14.3 13.4  HCT 43.8  41.8  MCV 95.0 94.8  PLT 234 215   Cardiac Enzymes: Recent Labs  Lab 04/15/20 2337  CKTOTAL 362   BNP: Invalid input(s): POCBNP CBG: No results for input(s): GLUCAP in the last 168 hours. D-Dimer No results for input(s): DDIMER in the last 72 hours. Hgb A1c No results for input(s): HGBA1C in the last 72 hours. Lipid Profile No results for input(s): CHOL, HDL, LDLCALC, TRIG, CHOLHDL, LDLDIRECT in the last 72 hours. Thyroid function studies Recent Labs    04/17/20 0408  TSH 3.211   Anemia work up No results for input(s): VITAMINB12, FOLATE, FERRITIN, TIBC, IRON, RETICCTPCT in the last 72 hours. Urinalysis    Component Value Date/Time   COLORURINE YELLOW 04/16/2020 0209   APPEARANCEUR CLEAR 04/16/2020 0209   LABSPEC 1.015 04/16/2020 0209   PHURINE 5.0 04/16/2020 0209   GLUCOSEU NEGATIVE 04/16/2020 0209   HGBUR LARGE (A) 04/16/2020 0209   BILIRUBINUR NEGATIVE 04/16/2020 0209   KETONESUR NEGATIVE 04/16/2020 0209   PROTEINUR NEGATIVE 04/16/2020 0209   NITRITE NEGATIVE 04/16/2020 0209   LEUKOCYTESUR NEGATIVE 04/16/2020 0209   Sepsis Labs Invalid input(s): PROCALCITONIN,  WBC,  LACTICIDVEN Microbiology Recent Results (from the past 240 hour(s))  SARS Coronavirus 2 by RT PCR (hospital order, performed in [facility] hospital lab) Nasopharyngeal Nasopharyngeal Swab     Status: None   Collection Time: 04/16/20  1:45 AM   Specimen: Nasopharyngeal Swab  Result Value Ref Range Status   SARS Coronavirus 2 NEGATIVE NEGATIVE Final    Comment: (NOTE) SARS-CoV-2 target nucleic acids are NOT DETECTED.  The SARS-CoV-2 RNA is generally detectable in upper and lower respiratory specimens during the acute phase of infection. The lowest concentration of SARS-CoV-2 viral copies this assay can detect is 250 copies / mL. A negative result does not preclude SARS-CoV-2 infection and should not be used as the sole basis for treatment or other patient management decisions.  A negative  result may occur with improper specimen collection / handling, submission of specimen other than nasopharyngeal swab, presence of viral mutation(s) within the areas targeted by this assay, and inadequate number of viral copies (<250 copies / mL). A negative result must be combined with clinical observations, patient history, and epidemiological information.  Fact Sheet for Patients:   StrictlyIdeas.no  Fact Sheet for Healthcare Providers: BankingDealers.co.za  This test is not yet approved or  cleared by the [place] FDA and has been authorized for detection and/or  diagnosis of SARS-CoV-2 by FDA under an Emergency Use Authorization (EUA).  This EUA will remain in effect (meaning this test can be used) for the duration of the COVID-19 declaration under Section 564(b)(1) of the Act, 21 U.S.C. section 360bbb-3(b)(1), unless the authorization is terminated or revoked sooner.  Performed at [facility], [address], [place], [place] [address]   Urine Culture     Status: None   Collection Time: 04/16/20  2:09 AM   Specimen: Urine, Catheterized  Result Value Ref Range Status   Specimen Description   Final    URINE, CATHETERIZED Performed at [facility], [address] [place], [place] [address]    Special Requests   Final    NONE Performed at [facility], [address], [place], [place] [address]    Culture   Final    NO GROWTH Performed at [facility], [place] [address] [place], [place] [address]    Report Status 04/17/2020 FINAL  Final     Time coordinating discharge:  I have spent 35 minutes face to face with the patient and on the ward discussing the patients care, assessment, plan and disposition with other care givers. >50% of the time was devoted counseling the patient about the risks and benefits of treatment/Discharge disposition and coordinating care.   SIGNED:   Damita Lack, MD  [facility] 04/17/2020, 12:20 PM   If 7PM-7AM, please contact night-coverage

## 2020-04-24 DIAGNOSIS — I5020 Unspecified systolic (congestive) heart failure: Secondary | ICD-10-CM | POA: Diagnosis not present

## 2020-04-24 DIAGNOSIS — I4891 Unspecified atrial fibrillation: Secondary | ICD-10-CM | POA: Diagnosis not present

## 2020-04-24 DIAGNOSIS — N390 Urinary tract infection, site not specified: Secondary | ICD-10-CM | POA: Diagnosis not present

## 2020-04-24 DIAGNOSIS — Z0001 Encounter for general adult medical examination with abnormal findings: Secondary | ICD-10-CM | POA: Diagnosis not present

## 2020-04-24 DIAGNOSIS — N400 Enlarged prostate without lower urinary tract symptoms: Secondary | ICD-10-CM | POA: Diagnosis not present

## 2020-04-24 DIAGNOSIS — R319 Hematuria, unspecified: Secondary | ICD-10-CM | POA: Diagnosis not present

## 2020-04-29 NOTE — Progress Notes (Signed)
Cardiology Office Note  Date: 04/30/2020   ID: Kevin, Rios 04/27/1941, MRN [id number]  PCP:  Celene Squibb, MD  Cardiologist:  Rozann Lesches, MD Electrophysiologist:  None   Chief Complaint: Hospital follow-up paroxysmal atrial fibrillation with RVR.  Chest 2 vas score 4 with unadjusted ischemic stroke rate 4.8% stroke risk per year  History of Present Illness: Kevin Rios is a 79 y.o. male with a history of HTN, HLD, BPH, hematuria, adenoma of the right adrenal gland, BPH, AKI, hypocalcemia, obesity.  Presented to the [facility] emergency room 04/15/2020 with hematuria diagnosed with UTI.  Hospital course complicated by atrial fibrillation with RVR.  He was started on amiodarone drip which made him hypotensive.  Cardiology was consulted.  His heart rate was controlled with  beta-blocker.  His ARB was discontinued due to low blood pressures during hospital stay.  Echocardiogram showed EF of 45%.  BNP was 145.  Creatinine 1.72.  He was given IV hydration and antibiotics for UTI.  Hematuria resolved.  Dr. Domenic Polite saw the patient in consult.  Toprol-XL was started.  His amlodipine/olmesartan 10/40 milligrams was held during hospitalization.  Plan to resume ARB prior to discharge if blood pressure allowed.  He was unsure if the patient would tolerate uninterrupted anticoagulation.  He stated he would not initiate oral anticoagulation until hematuria had resolved and been treated.  Depending on blood pressure might be able to add back low-dose ARB eventually.   Patient is here for hospital follow-up recent admission for hematuria and atrial fibrillation.  He had a UTI which was treated with antibiotics.  Had a recent follow-up with provider at Dr. Juel Burrow office.  He states he notices no blood in his urine although he states the UA at Dr. Juel Burrow office showed a small amount of blood in urine.  His blood pressure is well controlled today on current medication with blood pressure  128/78.  Heart rate is 61.  Patient states he is taking all of his antibiotics for his urinary tract infection.  He denies any anginal or exertional symptoms, palpitations or arrhythmias, orthostatic symptoms, CVA or TIA-like symptoms, PND, orthopnea, bleeding, lower extremity edema, DVT or PE-like symptoms, or claudication.  Past Medical History:  Diagnosis Date   Essential hypertension    Renal insufficiency    Possible history    Past Surgical History:  Procedure Laterality Date   HERNIA REPAIR      Current Outpatient Medications  Medication Sig Dispense Refill   acetaminophen (TYLENOL) 500 MG tablet Take 1,000 mg by mouth daily.     aspirin EC 81 MG tablet Take 81 mg by mouth at bedtime. Swallow whole.      furosemide (LASIX) 20 MG tablet Take 20 mg by mouth every other day.     meloxicam (MOBIC) 7.5 MG tablet Take 7.5 mg by mouth daily.     metoprolol succinate (TOPROL-XL) 25 MG 24 hr tablet Take 1 tablet (25 mg total) by mouth daily. 30 tablet 0   pravastatin (PRAVACHOL) 20 MG tablet Take 20 mg by mouth at bedtime.     tamsulosin (FLOMAX) 0.4 MG CAPS capsule Take 1 capsule (0.4 mg total) by mouth daily after supper. 30 capsule 0   No current facility-administered medications for this visit.   Allergies:  Patient has no known allergies.   Social History: The patient  reports that he has never smoked. He has never used smokeless tobacco. He reports current alcohol use. He reports that he  does not use drugs.   Family History: The patient's family history includes Hypertension in his father.   ROS:  Please see the history of present illness. Otherwise, complete review of systems is positive for none.  All other systems are reviewed and negative.   Physical Exam: VS:  BP 128/78    Pulse 61    Wt 245 lb (111.1 kg)    SpO2 95%    BMI 37.25 kg/m , BMI Body mass index is 37.25 kg/m.  Wt Readings from Last 3 Encounters:  04/30/20 245 lb (111.1 kg)  04/15/20 250 lb  (113.4 kg)    General: Obese patient appears comfortable at rest. Neck: Supple, no elevated JVP or carotid bruits, no thyromegaly. Lungs: Clear to auscultation, nonlabored breathing at rest. Cardiac: Regular rate and rhythm, no S3 or significant systolic murmur, no pericardial rub. Extremities: No pitting edema, distal pulses 2+. Skin: Warm and dry. Musculoskeletal: No kyphosis. Neuropsychiatric: Alert and oriented x3, affect grossly appropriate.  ECG:  EKG on April 17, 2020 showed atrial fibrillation with a rate of 76.  Recent Labwork: 04/15/2020: B Natriuretic Peptide 145.0 04/17/2020: ALT 14; AST 16; BUN 21; Creatinine, Ser 1.13; Hemoglobin 13.4; Magnesium 2.3; Platelets 215; Potassium 4.0; Sodium 138; TSH 3.211  No results found for: CHOL, TRIG, HDL, CHOLHDL, VLDL, LDLCALC, LDLDIRECT  Other Studies Reviewed Today:  Echocardiogram 04/16/2020  1. Left ventricular ejection fraction, by estimation, is 40 to 45%. The left ventricle has mildly decreased function. The left ventricle demonstrates global hypokinesis. There is mild left ventricular hypertrophy. Left ventricular diastolic parameters are indeterminate in the setting of atrial fibrillation. 2. Right ventricular systolic function is moderately reduced. The right ventricular size is normal. There is normal pulmonary artery systolic pressure. The estimated right ventricular systolic pressure is 18.5 mmHg. 3. Left atrial size was mildly dilated. 4. The mitral valve is grossly normal. Trivial mitral valve regurgitation. 5. The aortic valve is tricuspid. Aortic valve regurgitation is not visualized. 6. The inferior vena cava is normal in size with greater than 50% respiratory variability, suggesting right atrial pressure of 3 mmHg.  Assessment and Plan:  1. Paroxysmal atrial fibrillation (HCC)   2. Cardiomyopathy, unspecified type ([place])   3. Essential hypertension   4. Mixed hyperlipidemia    1. Paroxysmal atrial  fibrillation (HCC) Heart rate is controlled today with a rate of 61.  Pulse is irregularly irregular.  Continue Toprol-XL 25 mg.  Stop aspirin 81 mg.  Start Eliquis 5 mg p.o. twice daily.  Advised patient to watch for bleeding and if he begins to have hematuria to stop the medication and call us.  2. Cardiomyopathy, unspecified type ([place]) Recent echocardiogram on 04/16/2020 showed EF 40 to 45%.  Global hypokinesis.  Mild LVH, mild LA dilation.  Trivial MR.  EF believed to be arrhythmia related per Dr. Domenic Polite.  3. Essential hypertension Blood pressure well controlled on current therapy.  Continue Toprol-XL 25 mg daily.  Add losartan 12.5 mg daily.  4. Mixed hyperlipidemia Continue pravastatin 20 mg p.o. daily.  Medication Adjustments/Labs and Tests Ordered: Current medicines are reviewed at length with the patient today.  Concerns regarding medicines are outlined above.   Disposition: Follow-up with Dr. Domenic Polite or APP 1 month.  Signed, Levell July, NP 04/30/2020 9:01 AM    [facility] [place], [place], [place] [address] Phone: [phone]; Fax: [phone]

## 2020-04-30 ENCOUNTER — Ambulatory Visit (INDEPENDENT_AMBULATORY_CARE_PROVIDER_SITE_OTHER): Payer: Medicare Other | Admitting: Family Medicine

## 2020-04-30 ENCOUNTER — Encounter: Payer: Self-pay | Admitting: Family Medicine

## 2020-04-30 VITALS — BP 128/78 | HR 61 | Ht 63.0 in | Wt 245.0 lb

## 2020-04-30 DIAGNOSIS — I480 Paroxysmal atrial fibrillation: Secondary | ICD-10-CM | POA: Diagnosis not present

## 2020-04-30 DIAGNOSIS — E782 Mixed hyperlipidemia: Secondary | ICD-10-CM | POA: Diagnosis not present

## 2020-04-30 DIAGNOSIS — I429 Cardiomyopathy, unspecified: Secondary | ICD-10-CM

## 2020-04-30 DIAGNOSIS — I10 Essential (primary) hypertension: Secondary | ICD-10-CM | POA: Diagnosis not present

## 2020-04-30 MED ORDER — LOSARTAN POTASSIUM 25 MG PO TABS
12.5000 mg | ORAL_TABLET | Freq: Every day | ORAL | 6 refills | Status: DC
Start: 2020-04-30 — End: 2020-05-28

## 2020-04-30 MED ORDER — APIXABAN 5 MG PO TABS: 5.0000 mg | ORAL_TABLET | Freq: Two times a day (BID) | ORAL | 6 refills | Status: DC

## 2020-04-30 MED ORDER — APIXABAN 5 MG PO TABS: 5.0000 mg | ORAL_TABLET | Freq: Two times a day (BID) | ORAL | 0 refills | Status: DC

## 2020-04-30 NOTE — Patient Instructions (Addendum)
Medication Instructions:   Begin Eliquis 5mg  twice a day.  Stop Aspirin.  Begin Losartan 12.5mg  daily.   Continue all other medications.    Labwork: none  Testing/Procedures: none  Follow-Up: 1 month   Any Other Special Instructions Will Be Listed Below (If Applicable).  If you need a refill on your cardiac medications before your next appointment, please call your pharmacy.

## 2020-05-11 ENCOUNTER — Encounter: Payer: Self-pay | Admitting: Urology

## 2020-05-11 ENCOUNTER — Ambulatory Visit: Payer: Medicare Other | Admitting: Urology

## 2020-05-17 ENCOUNTER — Other Ambulatory Visit: Payer: Self-pay | Admitting: Family Medicine

## 2020-05-17 MED ORDER — METOPROLOL SUCCINATE ER 25 MG PO TB24: 25.0000 mg | ORAL_TABLET | Freq: Every day | ORAL | 6 refills | Status: DC

## 2020-05-17 NOTE — Telephone Encounter (Signed)
New message   Patient is completely out of medication needs filled today  *STAT* If patient is at the pharmacy, call can be transferred to refill team.   1. Which medications need to be refilled? (please list name of each medication and dose if known) metoprolol succinate (TOPROL-XL) 25 MG 24 hr tablet tamsulosin (FLOMAX) 0.4 MG CAPS capsule 2. Which pharmacy/location (including street and city if local pharmacy) is medication to be sent to? [facility]  3. Do they need a 30 day or 90 day supply? [place]

## 2020-05-17 NOTE — Telephone Encounter (Signed)
Left message on answer machine that metoprolol sent and tamsulosin would need to be requested from PCP.

## 2020-05-27 NOTE — Progress Notes (Signed)
Cardiology Office Note  Date: 05/28/2020   ID: [name], DOB 1940-09-27, MRN [id number]  PCP:  Celene Squibb, MD  Cardiologist:  Rozann Lesches, MD Electrophysiologist:  None   Chief Complaint: Hospital follow-up paroxysmal atrial fibrillation with RVR.  Chest 2 vas score 4 with unadjusted ischemic stroke rate 4.8% stroke risk per year  History of Present Illness: Kevin Rios is a 79 y.o. male with a history of HTN, HLD, BPH, hematuria, adenoma of the right adrenal gland, BPH, AKI, hypocalcemia, obesity.  Presented to the [facility] emergency room 04/15/2020 with hematuria diagnosed with UTI.  Hospital course complicated by atrial fibrillation with RVR.  He was started on amiodarone drip which made him hypotensive.  Cardiology was consulted.  His heart rate was controlled with  beta-blocker.  His ARB was discontinued due to low blood pressures during hospital stay.  Echocardiogram showed EF of 45%.  BNP was 145.  Creatinine 1.72.  He was given IV hydration and antibiotics for UTI.  Hematuria resolved.  Dr. Domenic Polite saw the patient in consult.  Toprol-XL was started.  His amlodipine/olmesartan 10/40 milligrams was held during hospitalization.  Plan to resume ARB prior to discharge if blood pressure allowed.  He was unsure if the patient would tolerate uninterrupted anticoagulation.  He stated he would not initiate oral anticoagulation until hematuria had resolved and been treated.  Depending on blood pressure might be able to add back low-dose ARB eventually.   At last visit patient was here for hospital follow-up recent admission for hematuria and atrial fibrillation.  He had a UTI which was treated with antibiotics.  Had a recent follow-up with provider at Dr. Juel Burrow office.  He he stated he noticed no blood in his urine although he states the UA at Dr. Juel Burrow office showed a small amount of blood in urine.  His blood pressure is well controlled today on current medication with  blood pressure 128/78.  Heart rate is 61.  Patient stated he was taking all of his antibiotics for his urinary tract infection.  He denies any anginal or exertional symptoms, palpitations or arrhythmias, orthostatic symptoms, CVA or TIA-like symptoms, PND, orthopnea, bleeding, lower extremity edema, DVT or PE-like symptoms, or claudication.   Patient is here for 1 month follow-up.  He denies any recent issues with anginal or exertional symptoms.  States he has been added in his yard using his chainsaw without any significant anginal or exertional symptoms.  Denies any palpitations or arrhythmias, orthostatic symptoms, CVA or TIA-like symptoms, PND, orthopnea.  States he has been eating a lot of spam lately which has a high sodium concentration.  His blood pressure on arrival was 180/110.  Recheck in right arm was 170/110.  He attributes the increase in blood pressure to eating spam over the weekend.  Denies any claudication-like symptoms, DVT or PE-like symptoms, or lower extremity edema.  He states his primary care provider put him on some knee medication for his arthritis.  It appears he is on meloxicam 7.5 mg p.o. daily as well as Tylenol 500 mg p.o. twice daily scheduled.  Past Medical History:  Diagnosis Date   Essential hypertension    Renal insufficiency    Possible history    Past Surgical History:  Procedure Laterality Date   HERNIA REPAIR      Current Outpatient Medications  Medication Sig Dispense Refill   acetaminophen (TYLENOL) 500 MG tablet Take 1,000 mg by mouth daily.     apixaban (ELIQUIS)  5 MG TABS tablet Take 1 tablet (5 mg total) by mouth 2 (two) times daily. 60 tablet 6   furosemide (LASIX) 20 MG tablet Take 20 mg by mouth every other day.     losartan (COZAAR) 25 MG tablet Take 0.5 tablets (12.5 mg total) by mouth daily. 15 tablet 6   meloxicam (MOBIC) 7.5 MG tablet Take 7.5 mg by mouth daily.     metoprolol succinate (TOPROL-XL) 25 MG 24 hr tablet Take 1 tablet  (25 mg total) by mouth daily. 30 tablet 6   pravastatin (PRAVACHOL) 20 MG tablet Take 20 mg by mouth at bedtime.     tamsulosin (FLOMAX) 0.4 MG CAPS capsule Take 1 capsule (0.4 mg total) by mouth daily after supper. 30 capsule 0   No current facility-administered medications for this visit.   Allergies:  Patient has no known allergies.   Social History: The patient  reports that he has never smoked. He has never used smokeless tobacco. He reports current alcohol use. He reports that he does not use drugs.   Family History: The patient's family history includes Hypertension in his father.   ROS:  Please see the history of present illness. Otherwise, complete review of systems is positive for none.  All other systems are reviewed and negative.   Physical Exam: VS:  BP (!) 180/110    Pulse 80    Ht 5\' 3"  (1.6 m)    Wt 253 lb (114.8 kg)    SpO2 97%    BMI 44.82 kg/m , BMI Body mass index is 44.82 kg/m.  Wt Readings from Last 3 Encounters:  05/28/20 253 lb (114.8 kg)  04/30/20 245 lb (111.1 kg)  04/15/20 250 lb (113.4 kg)    General: Obese patient appears comfortable at rest. Neck: Supple, no elevated JVP or carotid bruits, no thyromegaly. Lungs: Clear to auscultation, nonlabored breathing at rest. Cardiac: Regular rate and rhythm, no S3 or significant systolic murmur, no pericardial rub. Extremities: No pitting edema, distal pulses 2+. Skin: Warm and dry. Musculoskeletal: No kyphosis. Neuropsychiatric: Alert and oriented x3, affect grossly appropriate.  ECG:  EKG on April 17, 2020 showed atrial fibrillation with a rate of 76.  Recent Labwork: 04/15/2020: B Natriuretic Peptide 145.0 04/17/2020: ALT 14; AST 16; BUN 21; Creatinine, Ser 1.13; Hemoglobin 13.4; Magnesium 2.3; Platelets 215; Potassium 4.0; Sodium 138; TSH 3.211  No results found for: CHOL, TRIG, HDL, CHOLHDL, VLDL, LDLCALC, LDLDIRECT  Other Studies Reviewed Today:  Echocardiogram 04/16/2020  1. Left ventricular  ejection fraction, by estimation, is 40 to 45%. The left ventricle has mildly decreased function. The left ventricle demonstrates global hypokinesis. There is mild left ventricular hypertrophy. Left ventricular diastolic parameters are indeterminate in the setting of atrial fibrillation. 2. Right ventricular systolic function is moderately reduced. The right ventricular size is normal. There is normal pulmonary artery systolic pressure. The estimated right ventricular systolic pressure is 32.9 mmHg. 3. Left atrial size was mildly dilated. 4. The mitral valve is grossly normal. Trivial mitral valve regurgitation. 5. The aortic valve is tricuspid. Aortic valve regurgitation is not visualized. 6. The inferior vena cava is normal in size with greater than 50% respiratory variability, suggesting right atrial pressure of 3 mmHg.  Assessment and Plan:   1. Paroxysmal atrial fibrillation (HCC) Heart rate is controlled today with a rate of 80.  Pulse is irregularly irregular.  Continue Toprol-XL 25 mg.  Stop aspirin 81 mg.  Continue Eliquis 5 mg p.o. twice daily.  Denies any bleeding  in stool or urine.  2. Cardiomyopathy, unspecified type ([place]) Recent echocardiogram on 04/16/2020 showed EF 40 to 45%.  Global hypokinesis.  Mild LVH, mild LA dilation.  Trivial MR.  EF believed to be arrhythmia related per Dr. Domenic Polite.  3. Essential hypertension Blood pressure elevated initially at 180/110.  Recheck in right arm 170/110.  He attributes increased blood pressure to eating ham over the weekend.  Continue Toprol-XL 25 mg daily.  Increase losartan to 25 mg daily.  Daily BMP in 2 weeks after increasing losartan.   4. Mixed hyperlipidemia Continue pravastatin 20 mg p.o. daily.  Medication Adjustments/Labs and Tests Ordered: Current medicines are reviewed at length with the patient today.  Concerns regarding medicines are outlined above.   Disposition: Follow-up with Dr. Domenic Polite or APP 6  months  Signed, Levell July, NP 05/28/2020 9:21 AM    [facility] at [address], [place], [place] [address] Phone: [phone]; Fax: [phone]

## 2020-05-28 ENCOUNTER — Encounter: Payer: Self-pay | Admitting: Family Medicine

## 2020-05-28 ENCOUNTER — Other Ambulatory Visit: Payer: Self-pay

## 2020-05-28 ENCOUNTER — Ambulatory Visit (INDEPENDENT_AMBULATORY_CARE_PROVIDER_SITE_OTHER): Payer: Medicare Other | Admitting: Family Medicine

## 2020-05-28 VITALS — BP 180/110 | HR 80 | Ht 63.0 in | Wt 253.0 lb

## 2020-05-28 DIAGNOSIS — I10 Essential (primary) hypertension: Secondary | ICD-10-CM | POA: Diagnosis not present

## 2020-05-28 MED ORDER — LOSARTAN POTASSIUM 25 MG PO TABS
25.0000 mg | ORAL_TABLET | Freq: Every day | ORAL | 6 refills | Status: DC
Start: 2020-05-28 — End: 2020-12-07

## 2020-05-28 NOTE — Patient Instructions (Addendum)
Medication Instructions:   Your physician has recommended you make the following change in your medication:   Increase losartan to 25 mg by mouth daily  Continue other medications the same  Labwork:  Your physician recommends that you return for lab work in: 2 weeks to check your BMET. This may be done at  [facility] or [facility] ([place]) Monday-Friday from 8:00 am - 4:00 pm. No appointment is needed.  Testing/Procedures:  None  Follow-Up:  Your physician recommends that you schedule a follow-up appointment in: 6 months.  Any Other Special Instructions Will Be Listed Below (If Applicable).  If you need a refill on your cardiac medications before your next appointment, please call your pharmacy.

## 2020-05-29 ENCOUNTER — Telehealth: Payer: Self-pay | Admitting: Family Medicine

## 2020-05-29 NOTE — Telephone Encounter (Signed)
Pt sister is aware that Eliquis was sent in August with 6 refills - already has gotten losartan yesterday - aware to check with pharmacy on when Eliquis refill would be ready

## 2020-05-29 NOTE — Telephone Encounter (Signed)
Patient called stating that the Eliquis RX was not called to [facility].

## 2020-06-11 ENCOUNTER — Ambulatory Visit: Payer: Medicare Other

## 2020-06-26 ENCOUNTER — Ambulatory Visit (INDEPENDENT_AMBULATORY_CARE_PROVIDER_SITE_OTHER): Payer: Medicare Other | Admitting: Urology

## 2020-06-26 ENCOUNTER — Encounter: Payer: Self-pay | Admitting: Urology

## 2020-06-26 ENCOUNTER — Other Ambulatory Visit: Payer: Self-pay

## 2020-06-26 VITALS — BP 199/94 | HR 83 | Temp 98.7°F | Ht 63.0 in | Wt 253.0 lb

## 2020-06-26 DIAGNOSIS — N400 Enlarged prostate without lower urinary tract symptoms: Secondary | ICD-10-CM

## 2020-06-26 DIAGNOSIS — R319 Hematuria, unspecified: Secondary | ICD-10-CM

## 2020-06-26 LAB — URINALYSIS, ROUTINE W REFLEX MICROSCOPIC
Bilirubin, UA: NEGATIVE
Glucose, UA: NEGATIVE
Ketones, UA: NEGATIVE
Leukocytes,UA: NEGATIVE
Nitrite, UA: NEGATIVE
Specific Gravity, UA: 1.02 (ref 1.005–1.030)
Urobilinogen, Ur: 1 mg/dL (ref 0.2–1.0)
pH, UA: 7 (ref 5.0–7.5)

## 2020-06-26 LAB — MICROSCOPIC EXAMINATION
Bacteria, UA: NONE SEEN
RBC, Urine: >30 /hpf — AB (ref 0–2)
Renal Epithel, UA: NONE SEEN /hpf
WBC, UA: NONE SEEN /hpf (ref 0–5)

## 2020-06-26 NOTE — Progress Notes (Signed)
History of Present Illness: 79 year old male referred by Dr. Nevada Crane for hematuria. He had 1 episode of gross painless hematuria. This happened about 2 mos ago. He states that he is not on antiplt therapy, but it appears that he has a prescription for Eliquis.  He had a noncontrasted CT which revealed no upper tract abnormalities, large prostate.  He has had microscopic hematuria on prior urinalysis.  Past Medical History:  Diagnosis Date  . Essential hypertension   . Renal insufficiency    Possible history    Past Surgical History:  Procedure Laterality Date  . HERNIA REPAIR      Home Medications:  Allergies as of 06/26/2020   No Known Allergies     Medication List       Accurate as of June 26, 2020  8:20 AM. If you have any questions, ask your nurse or doctor.        acetaminophen 500 MG tablet Commonly known as: TYLENOL Take 1,000 mg by mouth daily.   apixaban 5 MG Tabs tablet Commonly known as: ELIQUIS Take 1 tablet (5 mg total) by mouth 2 (two) times daily.   furosemide 20 MG tablet Commonly known as: LASIX Take 20 mg by mouth every other day.   losartan 25 MG tablet Commonly known as: COZAAR Take 1 tablet (25 mg total) by mouth daily.   meloxicam 7.5 MG tablet Commonly known as: MOBIC Take 7.5 mg by mouth daily.   metoprolol succinate 25 MG 24 hr tablet Commonly known as: TOPROL-XL Take 1 tablet (25 mg total) by mouth daily.   pravastatin 20 MG tablet Commonly known as: PRAVACHOL Take 20 mg by mouth at bedtime.   tamsulosin 0.4 MG Caps capsule Commonly known as: FLOMAX Take 1 capsule (0.4 mg total) by mouth daily after supper.       Allergies: No Known Allergies  Family History  Problem Relation Age of Onset  . Hypertension Father     Social History:  reports that he has never smoked. He has never used smokeless tobacco. He reports current alcohol use. He reports that he does not use drugs.  ROS: A complete review of systems was  performed.  All systems are negative except for pertinent findings as noted.  Physical Exam:  Vital signs in last 24 hours: There were no vitals taken for this visit. Constitutional:  Alert and oriented, No acute distress Cardiovascular: Regular rate  Respiratory: Normal respiratory effort GI: Abdomen is soft, nontender, nondistended, no abdominal masses. No CVAT.  Abdomen is obese, no inguinal hernias noted. Genitourinary: Normal male phallus, uncircumcised, buried. Testes are descended bilaterally and non-tender and without masses, scrotum is normal in appearance without lesions or masses, perineum is normal on inspection. Lymphatic: No lymphadenopathy Neurologic: Grossly intact, no focal deficits Psychiatric: Normal mood and affect  I have reviewed prior pt notes  I have reviewed notes from referring/previous physicians  I have reviewed urinalysis results  I have independently reviewed prior imaging  Radiologic Imaging: I reviewed the patient's CT scan images.  Impression/Assessment:  1.  History of gross hematuria with persistent microscopic hematuria.  No history of smoking.  He has had noncontrasted CT but in his age, I would recommend a contrasted CT  2.  BPH, mild symptoms on tamsulosin.  Large prostate on CT, benign exam today.  Plan:  1.  I will arrange for the patient to have a hematuria protocol CT with and without IV contrast  2.  I will have him come  back following that for cystoscopy  3.  The patient states he is having blood drawn tomorrow by Dr. Nevada Crane, hopefully that will have BUN and creatinine

## 2020-06-26 NOTE — Progress Notes (Signed)
Urological Symptom Review  Patient is experiencing the following symptoms: Blood in urine   Review of Systems  Gastrointestinal (upper)  : Negative for upper GI symptoms  Gastrointestinal (lower) : Negative for lower GI symptoms  Constitutional : Negative for symptoms  Skin: Negative for skin symptoms  Eyes: Negative for eye symptoms  Ear/Nose/Throat : Negative for Ear/Nose/Throat symptoms  Hematologic/Lymphatic: Negative for Hematologic/Lymphatic symptoms  Cardiovascular : Negative for cardiovascular symptoms  Respiratory : Negative for respiratory symptoms  Endocrine: Negative for endocrine symptoms  Musculoskeletal: Negative for musculoskeletal symptoms  Neurological: Negative for neurological symptoms  Psychologic: Negative for psychiatric symptoms  

## 2020-06-27 DIAGNOSIS — R7303 Prediabetes: Secondary | ICD-10-CM | POA: Diagnosis not present

## 2020-06-27 DIAGNOSIS — E6609 Other obesity due to excess calories: Secondary | ICD-10-CM | POA: Diagnosis not present

## 2020-06-27 DIAGNOSIS — M25569 Pain in unspecified knee: Secondary | ICD-10-CM | POA: Diagnosis not present

## 2020-06-27 DIAGNOSIS — R7301 Impaired fasting glucose: Secondary | ICD-10-CM | POA: Diagnosis not present

## 2020-06-27 DIAGNOSIS — R600 Localized edema: Secondary | ICD-10-CM | POA: Diagnosis not present

## 2020-06-27 DIAGNOSIS — Z6837 Body mass index (BMI) 37.0-37.9, adult: Secondary | ICD-10-CM | POA: Diagnosis not present

## 2020-06-27 DIAGNOSIS — Z6834 Body mass index (BMI) 34.0-34.9, adult: Secondary | ICD-10-CM | POA: Diagnosis not present

## 2020-06-27 DIAGNOSIS — I10 Essential (primary) hypertension: Secondary | ICD-10-CM | POA: Diagnosis not present

## 2020-06-27 DIAGNOSIS — E875 Hyperkalemia: Secondary | ICD-10-CM | POA: Diagnosis not present

## 2020-06-27 DIAGNOSIS — N1831 Chronic kidney disease, stage 3a: Secondary | ICD-10-CM | POA: Diagnosis not present

## 2020-06-27 DIAGNOSIS — E782 Mixed hyperlipidemia: Secondary | ICD-10-CM | POA: Diagnosis not present

## 2020-06-27 DIAGNOSIS — R944 Abnormal results of kidney function studies: Secondary | ICD-10-CM | POA: Diagnosis not present

## 2020-07-02 DIAGNOSIS — R944 Abnormal results of kidney function studies: Secondary | ICD-10-CM | POA: Diagnosis not present

## 2020-07-02 DIAGNOSIS — M170 Bilateral primary osteoarthritis of knee: Secondary | ICD-10-CM | POA: Diagnosis not present

## 2020-07-02 DIAGNOSIS — R600 Localized edema: Secondary | ICD-10-CM | POA: Diagnosis not present

## 2020-07-02 DIAGNOSIS — R7301 Impaired fasting glucose: Secondary | ICD-10-CM | POA: Diagnosis not present

## 2020-07-02 DIAGNOSIS — E1169 Type 2 diabetes mellitus with other specified complication: Secondary | ICD-10-CM | POA: Diagnosis not present

## 2020-07-02 DIAGNOSIS — N1831 Chronic kidney disease, stage 3a: Secondary | ICD-10-CM | POA: Diagnosis not present

## 2020-07-02 DIAGNOSIS — M25569 Pain in unspecified knee: Secondary | ICD-10-CM | POA: Diagnosis not present

## 2020-07-02 DIAGNOSIS — I10 Essential (primary) hypertension: Secondary | ICD-10-CM | POA: Diagnosis not present

## 2020-07-16 ENCOUNTER — Telehealth: Payer: Self-pay | Admitting: *Deleted

## 2020-07-16 DIAGNOSIS — R600 Localized edema: Secondary | ICD-10-CM | POA: Diagnosis not present

## 2020-07-16 DIAGNOSIS — I10 Essential (primary) hypertension: Secondary | ICD-10-CM | POA: Diagnosis not present

## 2020-07-16 DIAGNOSIS — E1169 Type 2 diabetes mellitus with other specified complication: Secondary | ICD-10-CM | POA: Diagnosis not present

## 2020-07-16 DIAGNOSIS — R7301 Impaired fasting glucose: Secondary | ICD-10-CM | POA: Diagnosis not present

## 2020-07-16 DIAGNOSIS — M25569 Pain in unspecified knee: Secondary | ICD-10-CM | POA: Diagnosis not present

## 2020-07-16 DIAGNOSIS — M170 Bilateral primary osteoarthritis of knee: Secondary | ICD-10-CM | POA: Diagnosis not present

## 2020-07-16 DIAGNOSIS — N1831 Chronic kidney disease, stage 3a: Secondary | ICD-10-CM | POA: Diagnosis not present

## 2020-07-16 DIAGNOSIS — R944 Abnormal results of kidney function studies: Secondary | ICD-10-CM | POA: Diagnosis not present

## 2020-07-16 NOTE — Telephone Encounter (Signed)
-----   Message from Verta Ellen., NP sent at 07/12/2020  7:53 AM EST ----- Regarding: Labs. Yes the renal function labs are mildly decreased from previous. Otherwise everything looks Ok. Thanks ----- Message ----- From: Merlene Laughter, RN Sent: 07/09/2020   4:16 PM EST To: Verta Ellen., NP  Looks like you ask for BMET on him at last visit

## 2020-07-24 DIAGNOSIS — I129 Hypertensive chronic kidney disease with stage 1 through stage 4 chronic kidney disease, or unspecified chronic kidney disease: Secondary | ICD-10-CM | POA: Diagnosis not present

## 2020-07-24 DIAGNOSIS — E7849 Other hyperlipidemia: Secondary | ICD-10-CM | POA: Diagnosis not present

## 2020-07-24 DIAGNOSIS — N183 Chronic kidney disease, stage 3 unspecified: Secondary | ICD-10-CM | POA: Diagnosis not present

## 2020-07-31 DIAGNOSIS — R7301 Impaired fasting glucose: Secondary | ICD-10-CM | POA: Diagnosis not present

## 2020-07-31 DIAGNOSIS — N1831 Chronic kidney disease, stage 3a: Secondary | ICD-10-CM | POA: Diagnosis not present

## 2020-07-31 DIAGNOSIS — R682 Dry mouth, unspecified: Secondary | ICD-10-CM | POA: Diagnosis not present

## 2020-07-31 DIAGNOSIS — R600 Localized edema: Secondary | ICD-10-CM | POA: Diagnosis not present

## 2020-07-31 DIAGNOSIS — I480 Paroxysmal atrial fibrillation: Secondary | ICD-10-CM | POA: Diagnosis not present

## 2020-07-31 DIAGNOSIS — R319 Hematuria, unspecified: Secondary | ICD-10-CM | POA: Diagnosis not present

## 2020-07-31 DIAGNOSIS — I10 Essential (primary) hypertension: Secondary | ICD-10-CM | POA: Diagnosis not present

## 2020-07-31 DIAGNOSIS — R944 Abnormal results of kidney function studies: Secondary | ICD-10-CM | POA: Diagnosis not present

## 2020-08-03 ENCOUNTER — Other Ambulatory Visit: Payer: Self-pay

## 2020-08-03 ENCOUNTER — Ambulatory Visit (HOSPITAL_COMMUNITY)
Admission: RE | Admit: 2020-08-03 | Discharge: 2020-08-03 | Disposition: A | Discharge status: Home / Self Care | Payer: Medicare Other | Source: Ambulatory Visit | Attending: Urology | Admitting: Urology

## 2020-08-03 DIAGNOSIS — K4090 Unilateral inguinal hernia, without obstruction or gangrene, not specified as recurrent: Secondary | ICD-10-CM | POA: Diagnosis not present

## 2020-08-03 DIAGNOSIS — R319 Hematuria, unspecified: Secondary | ICD-10-CM | POA: Insufficient documentation

## 2020-08-03 DIAGNOSIS — K469 Unspecified abdominal hernia without obstruction or gangrene: Secondary | ICD-10-CM | POA: Diagnosis not present

## 2020-08-03 DIAGNOSIS — D3500 Benign neoplasm of unspecified adrenal gland: Secondary | ICD-10-CM | POA: Diagnosis not present

## 2020-08-03 LAB — POCT I-STAT CREATININE: Creatinine, Ser: 1.5 mg/dL — ABNORMAL HIGH (ref 0.61–1.24)

## 2020-08-03 MED ORDER — IOHEXOL 300 MG/ML  SOLN
100.0000 mL | Freq: Once | INTRAMUSCULAR | Status: AC | PRN
  Administered 2020-08-03: 100 mL via INTRAVENOUS

## 2020-08-03 MED ORDER — SODIUM CHLORIDE 0.9 % IV SOLN
INTRAVENOUS | Status: AC
  Filled 2020-08-03: qty 100

## 2020-08-03 MED ORDER — SODIUM CHLORIDE 0.9 % IV SOLN
INTRAVENOUS | Status: AC
  Filled 2020-08-03: qty 250

## 2020-08-07 ENCOUNTER — Ambulatory Visit (INDEPENDENT_AMBULATORY_CARE_PROVIDER_SITE_OTHER): Payer: Medicare Other | Admitting: Urology

## 2020-08-07 ENCOUNTER — Encounter: Payer: Self-pay | Admitting: Urology

## 2020-08-07 ENCOUNTER — Other Ambulatory Visit: Payer: Self-pay

## 2020-08-07 VITALS — BP 180/85 | HR 86 | Temp 98.6°F | Ht 63.0 in | Wt 250.0 lb

## 2020-08-07 DIAGNOSIS — N400 Enlarged prostate without lower urinary tract symptoms: Secondary | ICD-10-CM

## 2020-08-07 DIAGNOSIS — R319 Hematuria, unspecified: Secondary | ICD-10-CM | POA: Diagnosis not present

## 2020-08-07 LAB — MICROSCOPIC EXAMINATION
Epithelial Cells (non renal): NONE SEEN /hpf (ref 0–10)
Renal Epithel, UA: NONE SEEN /hpf

## 2020-08-07 LAB — URINALYSIS, ROUTINE W REFLEX MICROSCOPIC
Bilirubin, UA: NEGATIVE
Glucose, UA: NEGATIVE
Ketones, UA: NEGATIVE
Leukocytes,UA: NEGATIVE
Nitrite, UA: NEGATIVE
Specific Gravity, UA: 1.02 (ref 1.005–1.030)
Urobilinogen, Ur: 0.2 mg/dL (ref 0.2–1.0)
pH, UA: 7 (ref 5.0–7.5)

## 2020-08-07 MED ORDER — CIPROFLOXACIN HCL 500 MG PO TABS
500.0000 mg | ORAL_TABLET | Freq: Once | ORAL | Status: AC
  Administered 2020-08-07: 500 mg via ORAL

## 2020-08-07 NOTE — Progress Notes (Signed)
History of Present Illness: Here for followup of a single episode of gross painless hematuria. CT hematuria protocol performed recently.  Past Medical History:  Diagnosis Date  . Essential hypertension   . Renal insufficiency    Possible history    Past Surgical History:  Procedure Laterality Date  . HERNIA REPAIR      Home Medications:  Allergies as of 08/07/2020   No Known Allergies     Medication List       Accurate as of August 07, 2020  9:41 AM. If you have any questions, ask your nurse or doctor.        acetaminophen 500 MG tablet Commonly known as: TYLENOL Take 1,000 mg by mouth daily.   apixaban 5 MG Tabs tablet Commonly known as: ELIQUIS Take 1 tablet (5 mg total) by mouth 2 (two) times daily.   furosemide 20 MG tablet Commonly known as: LASIX Take 20 mg by mouth every other day.   losartan 25 MG tablet Commonly known as: COZAAR Take 1 tablet (25 mg total) by mouth daily.   meloxicam 7.5 MG tablet Commonly known as: MOBIC Take 7.5 mg by mouth daily.   metoprolol succinate 25 MG 24 hr tablet Commonly known as: TOPROL-XL Take 1 tablet (25 mg total) by mouth daily.   pravastatin 20 MG tablet Commonly known as: PRAVACHOL Take 20 mg by mouth at bedtime.   tamsulosin 0.4 MG Caps capsule Commonly known as: FLOMAX Take 1 capsule (0.4 mg total) by mouth daily after supper.       Allergies: No Known Allergies  Family History  Problem Relation Age of Onset  . Hypertension Father     Social History:  reports that he has never smoked. He has never used smokeless tobacco. He reports current alcohol use. He reports that he does not use drugs.  ROS: A complete review of systems was performed.  All systems are negative except for pertinent findings as noted.  Physical Exam:  Vital signs in last 24 hours: There were no vitals taken for this visit. Constitutional:  Alert and oriented, No acute distress Cardiovascular: Regular rate  Respiratory:  Normal respiratory effort Neurologic: Grossly intact, no focal deficits Psychiatric: Normal mood and affect  Cystoscopy Procedure Note:  Indication: Hematuria  After informed consent and discussion of the procedure and its risks, Kevin Rios was positioned and prepped in the standard fashion.  Cystoscopy was performed with a flexible cystoscope.   Findings: Urethra:Minimal midurethral strictures Prostate:Bilobar hypertrophy w/ mild obstruction Bladder neck:Nml Ureteral orifices:Nml Bladder:Mild trabeculations, no stones/urothelial abnormalities  The patient tolerated the procedure well.  I have reviewed prior pt notes  I have reviewed notes from referring/previous physicians  I have reviewed urinalysis results  I have independently reviewed prior imaging--CT-- 1. No CT findings in the urinary tract to explain the patient's history of hematuria. Bladder wall appears thickened and irregular, but this is likely accentuated by the lack of distension. 2. 7-8 mm hypoattenuating lesion in the anterior interpolar left kidney cannot be definitively characterized but approaches water density is probably a tiny cyst. Follow-up could be used to ensure stability. 3. 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 4. Right groin hernia contains only fat. 5. Aortic Atherosclerosis (ICD10-I70.0).    Renal Function: Recent Labs    08/03/20 1311  CREATININE 1.50*   CrCl cannot be calculated (Unknown ideal weight.).  Radiologic Imaging: No results found.  Impression/Assessment:  Hematuria w/ no abnormalities found on CT/cysto  Plan:  1.  Cytology sent  2. I'll see back in 6 mos to recheck

## 2020-08-07 NOTE — Progress Notes (Signed)
Urological Symptom Review  Patient is experiencing the following symptoms: none   Review of Systems  Gastrointestinal (upper)  : Negative for upper GI symptoms  Gastrointestinal (lower) : Negative for lower GI symptoms  Constitutional :  Negative for symptoms   Skin: negative  Eyes: Negative for eye symptoms  Ear/Nose/Throat : Negative for Ear/Nose/Throat symptoms  Hematologic/Lymphatic: Negative for Hematologic/Lymphatic symptoms  Cardiovascular : Negative for cardiovascular symptoms  Respiratory : Negative for respiratory symptoms  Endocrine: Negative for endocrine symptoms  Musculoskeletal: Negative for musculoskeletal symptoms  Neurological: Negative for neurological symptoms  Psychologic: Negative for psychiatric symptoms  

## 2020-08-08 DIAGNOSIS — R319 Hematuria, unspecified: Secondary | ICD-10-CM | POA: Diagnosis not present

## 2020-08-08 DIAGNOSIS — R682 Dry mouth, unspecified: Secondary | ICD-10-CM | POA: Diagnosis not present

## 2020-08-08 DIAGNOSIS — R600 Localized edema: Secondary | ICD-10-CM | POA: Diagnosis not present

## 2020-08-08 DIAGNOSIS — I480 Paroxysmal atrial fibrillation: Secondary | ICD-10-CM | POA: Diagnosis not present

## 2020-08-08 DIAGNOSIS — N1831 Chronic kidney disease, stage 3a: Secondary | ICD-10-CM | POA: Diagnosis not present

## 2020-08-08 DIAGNOSIS — I10 Essential (primary) hypertension: Secondary | ICD-10-CM | POA: Diagnosis not present

## 2020-08-08 DIAGNOSIS — R7301 Impaired fasting glucose: Secondary | ICD-10-CM | POA: Diagnosis not present

## 2020-08-08 DIAGNOSIS — R944 Abnormal results of kidney function studies: Secondary | ICD-10-CM | POA: Diagnosis not present

## 2020-08-08 LAB — CYTOLOGY, URINE

## 2020-08-09 ENCOUNTER — Telehealth: Payer: Self-pay

## 2020-08-09 NOTE — Telephone Encounter (Signed)
Lft message for pt to return call. Trying to let him know urine cytology came back normal.

## 2020-08-14 NOTE — Telephone Encounter (Signed)
Pt notified of results of cytology.

## 2020-08-31 DIAGNOSIS — I480 Paroxysmal atrial fibrillation: Secondary | ICD-10-CM | POA: Diagnosis not present

## 2020-08-31 DIAGNOSIS — R7301 Impaired fasting glucose: Secondary | ICD-10-CM | POA: Diagnosis not present

## 2020-08-31 DIAGNOSIS — R682 Dry mouth, unspecified: Secondary | ICD-10-CM | POA: Diagnosis not present

## 2020-08-31 DIAGNOSIS — R600 Localized edema: Secondary | ICD-10-CM | POA: Diagnosis not present

## 2020-08-31 DIAGNOSIS — R319 Hematuria, unspecified: Secondary | ICD-10-CM | POA: Diagnosis not present

## 2020-08-31 DIAGNOSIS — I10 Essential (primary) hypertension: Secondary | ICD-10-CM | POA: Diagnosis not present

## 2020-08-31 DIAGNOSIS — R944 Abnormal results of kidney function studies: Secondary | ICD-10-CM | POA: Diagnosis not present

## 2020-08-31 DIAGNOSIS — N1831 Chronic kidney disease, stage 3a: Secondary | ICD-10-CM | POA: Diagnosis not present

## 2020-09-06 DIAGNOSIS — R944 Abnormal results of kidney function studies: Secondary | ICD-10-CM | POA: Diagnosis not present

## 2020-09-06 DIAGNOSIS — I480 Paroxysmal atrial fibrillation: Secondary | ICD-10-CM | POA: Diagnosis not present

## 2020-09-06 DIAGNOSIS — R600 Localized edema: Secondary | ICD-10-CM | POA: Diagnosis not present

## 2020-09-06 DIAGNOSIS — N1831 Chronic kidney disease, stage 3a: Secondary | ICD-10-CM | POA: Diagnosis not present

## 2020-09-06 DIAGNOSIS — I10 Essential (primary) hypertension: Secondary | ICD-10-CM | POA: Diagnosis not present

## 2020-09-06 DIAGNOSIS — R319 Hematuria, unspecified: Secondary | ICD-10-CM | POA: Diagnosis not present

## 2020-09-24 DIAGNOSIS — I10 Essential (primary) hypertension: Secondary | ICD-10-CM | POA: Diagnosis not present

## 2020-09-29 DIAGNOSIS — E1169 Type 2 diabetes mellitus with other specified complication: Secondary | ICD-10-CM | POA: Diagnosis not present

## 2020-09-29 DIAGNOSIS — I480 Paroxysmal atrial fibrillation: Secondary | ICD-10-CM | POA: Diagnosis not present

## 2020-09-29 DIAGNOSIS — E782 Mixed hyperlipidemia: Secondary | ICD-10-CM | POA: Diagnosis not present

## 2020-09-29 DIAGNOSIS — I10 Essential (primary) hypertension: Secondary | ICD-10-CM | POA: Diagnosis not present

## 2020-10-29 DIAGNOSIS — I10 Essential (primary) hypertension: Secondary | ICD-10-CM | POA: Diagnosis not present

## 2020-10-29 DIAGNOSIS — E1169 Type 2 diabetes mellitus with other specified complication: Secondary | ICD-10-CM | POA: Diagnosis not present

## 2020-10-29 DIAGNOSIS — I480 Paroxysmal atrial fibrillation: Secondary | ICD-10-CM | POA: Diagnosis not present

## 2020-11-05 DIAGNOSIS — E782 Mixed hyperlipidemia: Secondary | ICD-10-CM | POA: Diagnosis not present

## 2020-11-05 DIAGNOSIS — R7301 Impaired fasting glucose: Secondary | ICD-10-CM | POA: Diagnosis not present

## 2020-11-05 DIAGNOSIS — I10 Essential (primary) hypertension: Secondary | ICD-10-CM | POA: Diagnosis not present

## 2020-11-05 DIAGNOSIS — R972 Elevated prostate specific antigen [PSA]: Secondary | ICD-10-CM | POA: Diagnosis not present

## 2020-11-05 DIAGNOSIS — E1169 Type 2 diabetes mellitus with other specified complication: Secondary | ICD-10-CM | POA: Diagnosis not present

## 2020-11-08 DIAGNOSIS — I10 Essential (primary) hypertension: Secondary | ICD-10-CM | POA: Diagnosis not present

## 2020-11-08 DIAGNOSIS — M170 Bilateral primary osteoarthritis of knee: Secondary | ICD-10-CM | POA: Diagnosis not present

## 2020-11-08 DIAGNOSIS — N1831 Chronic kidney disease, stage 3a: Secondary | ICD-10-CM | POA: Diagnosis not present

## 2020-11-08 DIAGNOSIS — E1169 Type 2 diabetes mellitus with other specified complication: Secondary | ICD-10-CM | POA: Diagnosis not present

## 2020-11-08 DIAGNOSIS — R600 Localized edema: Secondary | ICD-10-CM | POA: Diagnosis not present

## 2020-11-08 DIAGNOSIS — M25569 Pain in unspecified knee: Secondary | ICD-10-CM | POA: Diagnosis not present

## 2020-11-08 DIAGNOSIS — R944 Abnormal results of kidney function studies: Secondary | ICD-10-CM | POA: Diagnosis not present

## 2020-11-08 DIAGNOSIS — R7301 Impaired fasting glucose: Secondary | ICD-10-CM | POA: Diagnosis not present

## 2020-12-03 DIAGNOSIS — N1831 Chronic kidney disease, stage 3a: Secondary | ICD-10-CM | POA: Diagnosis not present

## 2020-12-03 DIAGNOSIS — E1169 Type 2 diabetes mellitus with other specified complication: Secondary | ICD-10-CM | POA: Diagnosis not present

## 2020-12-03 DIAGNOSIS — M170 Bilateral primary osteoarthritis of knee: Secondary | ICD-10-CM | POA: Diagnosis not present

## 2020-12-03 DIAGNOSIS — R944 Abnormal results of kidney function studies: Secondary | ICD-10-CM | POA: Diagnosis not present

## 2020-12-03 DIAGNOSIS — I10 Essential (primary) hypertension: Secondary | ICD-10-CM | POA: Diagnosis not present

## 2020-12-03 DIAGNOSIS — R600 Localized edema: Secondary | ICD-10-CM | POA: Diagnosis not present

## 2020-12-03 DIAGNOSIS — R7301 Impaired fasting glucose: Secondary | ICD-10-CM | POA: Diagnosis not present

## 2020-12-03 DIAGNOSIS — M25569 Pain in unspecified knee: Secondary | ICD-10-CM | POA: Diagnosis not present

## 2020-12-07 ENCOUNTER — Ambulatory Visit (INDEPENDENT_AMBULATORY_CARE_PROVIDER_SITE_OTHER): Payer: Medicare Other | Admitting: Family Medicine

## 2020-12-07 ENCOUNTER — Encounter: Payer: Self-pay | Admitting: Family Medicine

## 2020-12-07 ENCOUNTER — Other Ambulatory Visit: Payer: Self-pay

## 2020-12-07 VITALS — BP 140/90 | HR 73 | Ht 63.0 in | Wt 243.4 lb

## 2020-12-07 DIAGNOSIS — I480 Paroxysmal atrial fibrillation: Secondary | ICD-10-CM

## 2020-12-07 DIAGNOSIS — E782 Mixed hyperlipidemia: Secondary | ICD-10-CM

## 2020-12-07 DIAGNOSIS — I429 Cardiomyopathy, unspecified: Secondary | ICD-10-CM | POA: Diagnosis not present

## 2020-12-07 DIAGNOSIS — I10 Essential (primary) hypertension: Secondary | ICD-10-CM

## 2020-12-07 MED ORDER — AMLODIPINE BESYLATE 5 MG PO TABS: 5.0000 mg | ORAL_TABLET | Freq: Every day | ORAL | 6 refills | Status: DC

## 2020-12-07 NOTE — Patient Instructions (Addendum)
Medication Instructions:   Begin Amlodipine 5mg  daily.   Remain off of the Toprol.   Continue all current medications.  Labwork: none  Testing/Procedures: none  Follow-Up: 6 months   Any Other Special Instructions Will Be Listed Below (If Applicable).  If you need a refill on your cardiac medications before your next appointment, please call your pharmacy.

## 2020-12-07 NOTE — Progress Notes (Signed)
Cardiology Office Note  Date: 12/08/2020   ID: Kevin Rios, DOB [birth year], MRN [id number]  PCP:  Celene Squibb, MD  Cardiologist:  Rozann Lesches, MD Electrophysiologist:  None   Chief Complaint: Elevated BP  History of Present Illness: Kevin Rios is a 80 y.o. male with a history of HTN, HLD, BPH, hematuria, adenoma of the right adrenal gland, BPH, AKI, hypocalcemia, obesity.   Patient is here per request of PCP for elevated BP. He is on multiple antihypertensive medications. I have office notes from Dr Durene Cal office of recent visit. As near as I can discern from the notes he had stopped his Toprol XL because of the way it made him feel. His BP on arrival was 140/90. He has a med list in his paperwork. Amlodipine is not on that list. However, later on in the note it does mention he is taking Amlodipine. I suspect he may not be able to read. He mentions his sister fixes his medications for him. He did not bring the medications with him today. He states he has one bag of medications on his night stand for morning doses and one bag on his kitchen table for evening doses. He denies any symptoms. He appears frustrated with medication changes by PCP. He mentions a "brown pill" he was taken off claiming it helped his blood pressure. It is unclear as to whether he is currently taking Amlodipine. It appears he is not taking Toprol currently. BP has improved when compared to previously 2 Blood pressures on epic.   Past Medical History:  Diagnosis Date  . Essential hypertension   . Renal insufficiency    Possible history    Past Surgical History:  Procedure Laterality Date  . HERNIA REPAIR      Current Outpatient Medications  Medication Sig Dispense Refill  . amLODipine (NORVASC) 5 MG tablet Take 1 tablet (5 mg total) by mouth daily. 30 tablet 6  . apixaban (ELIQUIS) 5 MG TABS tablet Take 1 tablet (5 mg total) by mouth 2 (two) times daily. 60 tablet 6  .  Boswellia-Glucosamine-Vit D (OSTEO BI-FLEX ONE PER DAY PO) Take 1 tablet by mouth daily.    . cloNIDine (CATAPRES) 0.2 MG tablet Take 0.2 mg by mouth 2 (two) times daily.    . furosemide (LASIX) 20 MG tablet Take 20 mg by mouth every other day.     . losartan (COZAAR) 100 MG tablet Take 1 tablet by mouth daily.    . meloxicam (MOBIC) 7.5 MG tablet Take 7.5 mg by mouth daily.     . pravastatin (PRAVACHOL) 40 MG tablet Take 40 mg by mouth daily.    . tamsulosin (FLOMAX) 0.4 MG CAPS capsule Take 1 capsule (0.4 mg total) by mouth daily after supper. 30 capsule 0  . traMADol (ULTRAM) 50 MG tablet Take 50 mg by mouth every 6 (six) hours as needed.     No current facility-administered medications for this visit.   Allergies:  Patient has no known allergies.   Social History: The patient  reports that he has never smoked. He has never used smokeless tobacco. He reports current alcohol use. He reports that he does not use drugs.   Family History: The patient's family history includes Hypertension in his father.   ROS:  Please see the history of present illness. Otherwise, complete review of systems is positive for none.  All other systems are reviewed and negative.   Physical Exam: VS:  BP 140/90  Pulse 73   Ht 5\' 3"  (1.6 m)   Wt 243 lb 6.4 oz (110.4 kg)   SpO2 97%   BMI 43.12 kg/m , BMI Body mass index is 43.12 kg/m.  Wt Readings from Last 3 Encounters:  12/07/20 243 lb 6.4 oz (110.4 kg)  08/07/20 250 lb (113.4 kg)  06/26/20 253 lb (114.8 kg)    General: Obese patient appears comfortable at rest. Neck: Supple, no elevated JVP or carotid bruits, no thyromegaly. Lungs: Clear to auscultation, nonlabored breathing at rest. Cardiac: Regular rate and rhythm, no S3 or significant systolic murmur, no pericardial rub. Extremities: No pitting edema, distal pulses 2+. Skin: Warm and dry. Musculoskeletal: No kyphosis. Neuropsychiatric: Alert and oriented x3, affect grossly appropriate.  ECG:   EKG on April 17, 2020 showed atrial fibrillation with a rate of 76.  Recent Labwork: 04/15/2020: B Natriuretic Peptide 145.0 04/17/2020: ALT 14; AST 16; BUN 21; Hemoglobin 13.4; Magnesium 2.3; Platelets 215; Potassium 4.0; Sodium 138; TSH 3.211 08/03/2020: Creatinine, Ser 1.50  No results found for: CHOL, TRIG, HDL, CHOLHDL, VLDL, LDLCALC, LDLDIRECT  Other Studies Reviewed Today:  Echocardiogram 04/16/2020  1. Left ventricular ejection fraction, by estimation, is 40 to 45%. The left ventricle has mildly decreased function. The left ventricle demonstrates global hypokinesis. There is mild left ventricular hypertrophy. Left ventricular diastolic parameters are indeterminate in the setting of atrial fibrillation. 2. Right ventricular systolic function is moderately reduced. The right ventricular size is normal. There is normal pulmonary artery systolic pressure. The estimated right ventricular systolic pressure is 29.5 mmHg. 3. Left atrial size was mildly dilated. 4. The mitral valve is grossly normal. Trivial mitral valve regurgitation. 5. The aortic valve is tricuspid. Aortic valve regurgitation is not visualized. 6. The inferior vena cava is normal in size with greater than 50% respiratory variability, suggesting right atrial pressure of 3 mmHg.  Assessment and Plan:   1. Paroxysmal atrial fibrillation (HCC) Heart rate is controlled today with a rate of 73.  Pulse is irregularly irregular.  Recent notes from PCP state patient stopped Toprol-XL due to the way it made him feel.  Unable to discern from patient today whether he is currently taking.  We plan to call his sister for clarification. She fixes his medications for him. I suspect he is unable to read. Continue Eliquis 5 mg p.o. twice daily.  Denies any bleeding in stool or urine.  2. Cardiomyopathy, unspecified type ([place]) Recent echocardiogram on 04/16/2020 showed EF 40 to 45%. Global hypokinesis.  Mild LVH, mild LA dilation.   Trivial MR.  EF believed to be arrhythmia related per Dr. Domenic Polite.  3. Essential hypertension Blood pressure today 140/90. There is improvement from previous BP's. BP at recent PCP office visit 132/80 per note. Continue losartan 100 mg daily, amlodipine 5 mg po daily, clonidine 0.2 mg po bid, lasix 20 mg every other day. Will clarify with patient's sister as to whether he continues to take Toprol XL or not.    4. Mixed hyperlipidemia Continue pravastatin 20 mg p.o. daily.  Medication Adjustments/Labs and Tests Ordered: Current medicines are reviewed at length with the patient today.  Concerns regarding medicines are outlined above.   Disposition: Follow-up with Dr. Domenic Polite or APP 6 months  Signed, Levell July, NP 12/08/2020 3:48 PM    [facility] [place], [place], [place] [address] Phone: [phone]; Fax: [phone]

## 2020-12-10 ENCOUNTER — Telehealth: Payer: Self-pay | Admitting: *Deleted

## 2020-12-10 NOTE — Telephone Encounter (Signed)
Patient in office on Friday, 12/07/2020 to see Katina Dung, NP - need to speak with sister Vaughan Basta) to verify medication list.   Left message to return call.'

## 2020-12-12 NOTE — Telephone Encounter (Signed)
Patient walked into office with medication bottles - see updated list.

## 2020-12-13 NOTE — Telephone Encounter (Signed)
His blood pressure was still up at last visit at 140/90. Ask him if he is willing to go up on the amlodipine to 10 mg daily from the 5 mg he is on now. If so go ahead and send it in. You may want to let his sister know so she can dose it accordingly. Thanks

## 2020-12-14 NOTE — Telephone Encounter (Signed)
Patient notified and verbalized understanding.  His sister is bringing him a new BP monitor today.  He will keep a log of readings x 2-3 weeks & call office with results.  He will hold on any increase in medication till after this.

## 2021-01-03 IMAGING — CT CT RENAL STONE PROTOCOL
2 of 4 series · 16 of 46 positions shown, 18 images · non-contrast
Comparison: Remote pelvis CT 11/01/2006

CLINICAL DATA: 78-year-old with hematuria.

EXAM:
CT ABDOMEN AND PELVIS WITHOUT CONTRAST
TECHNIQUE: Multidetector CT imaging of the abdomen and pelvis was performed
following the standard protocol without IV contrast.

[Series 2: axial st · axial · 1.04mm/px · z∈[+369,+804]mm · 13 of 99 slices shown, 15 images]
[im 6/99  soft-tissue]
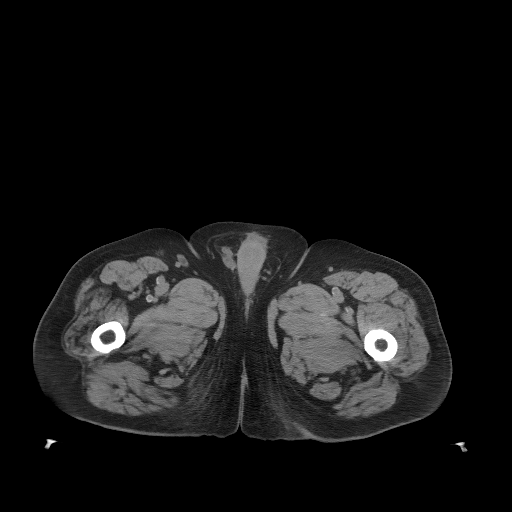
[im 6/99  bone]
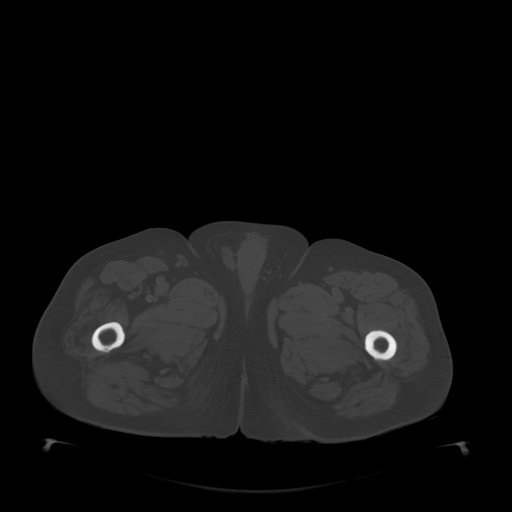
[im 16/99  soft-tissue]
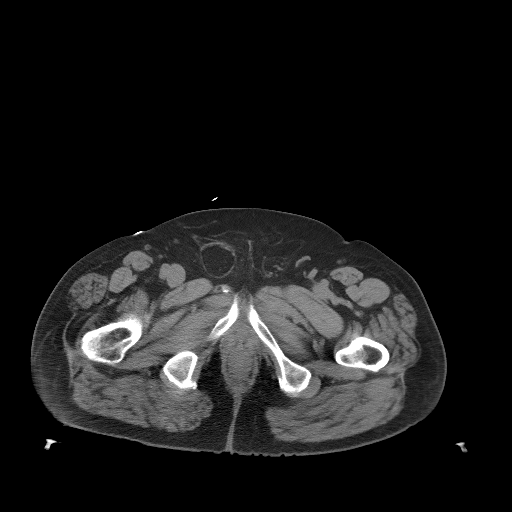
[im 21/99  soft-tissue]
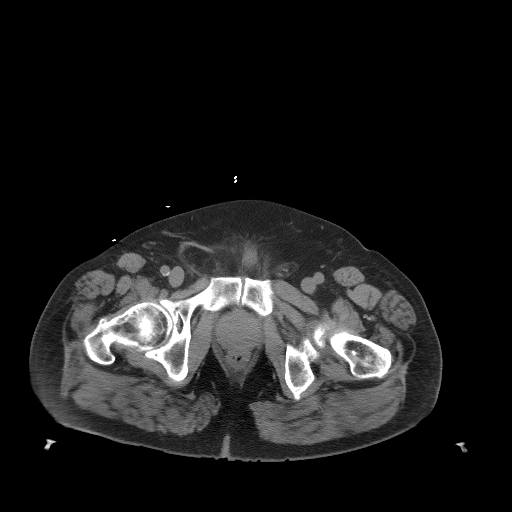
[im 26/99  soft-tissue]
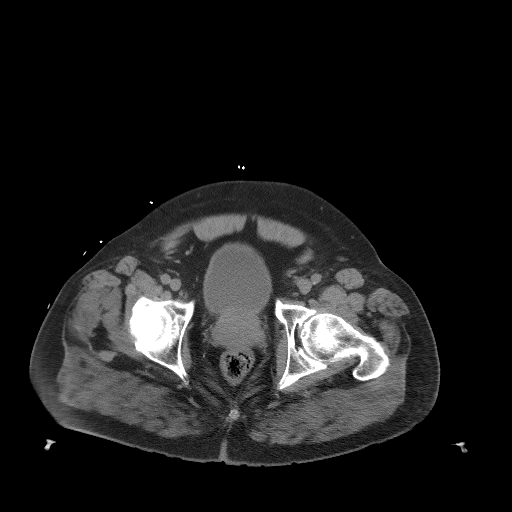
[im 37/99  soft-tissue]
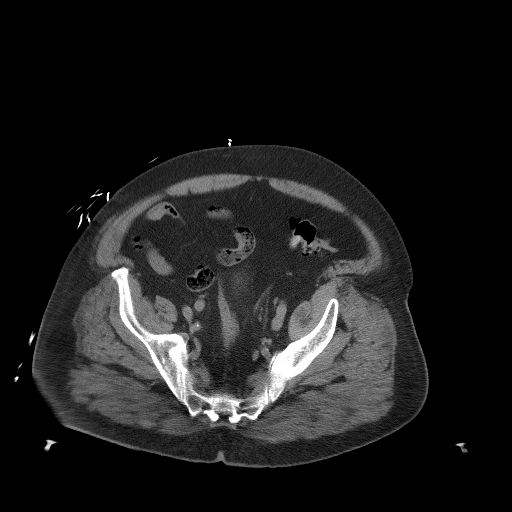
[im 42/99  soft-tissue]
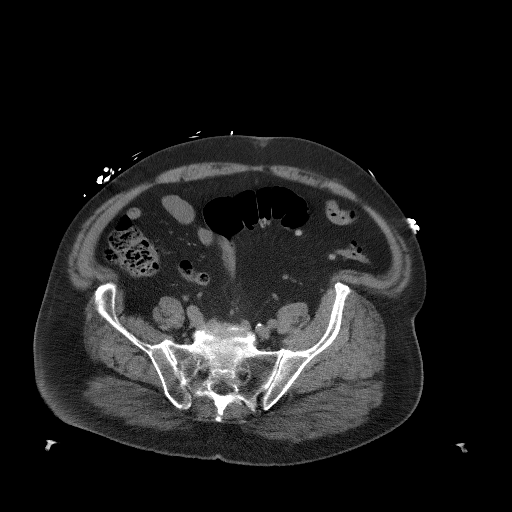
[im 52/99  soft-tissue]
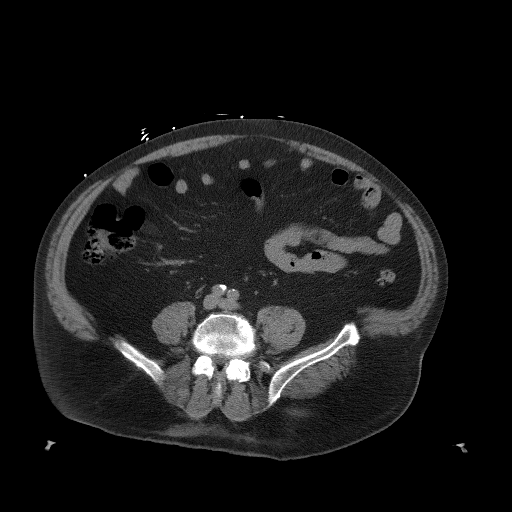
[im 57/99  soft-tissue]
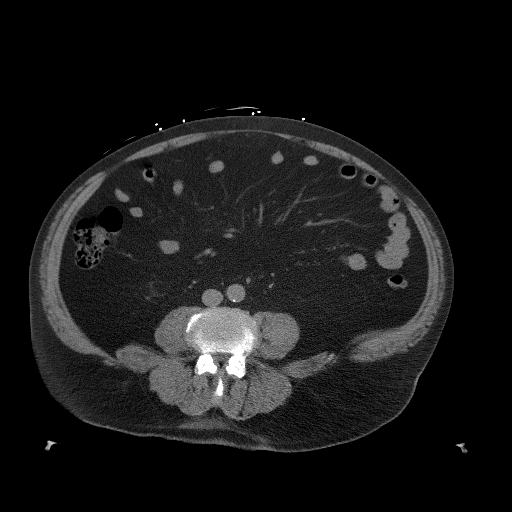
[im 62/99  soft-tissue]
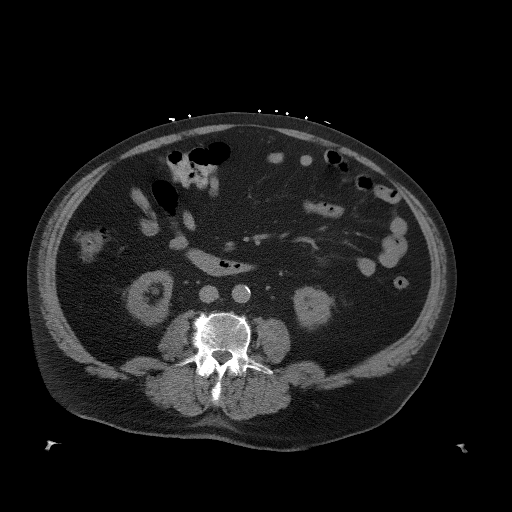
[im 62/99  bone]
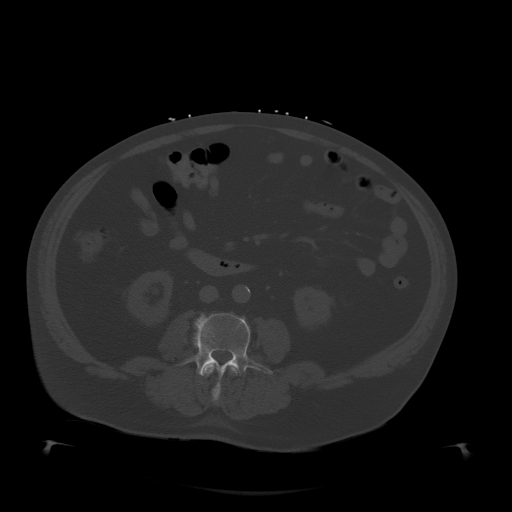
[im 73/99  soft-tissue]
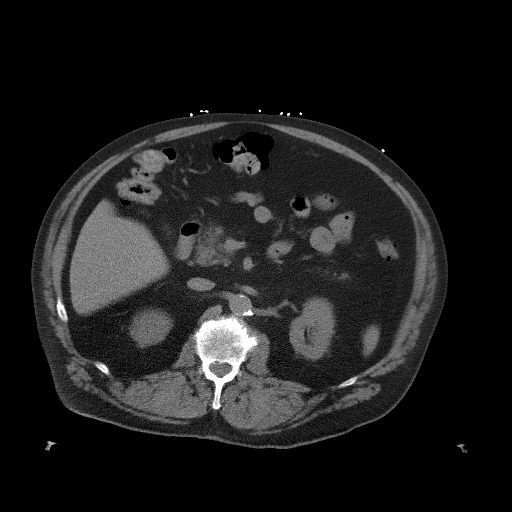
[im 78/99  soft-tissue]
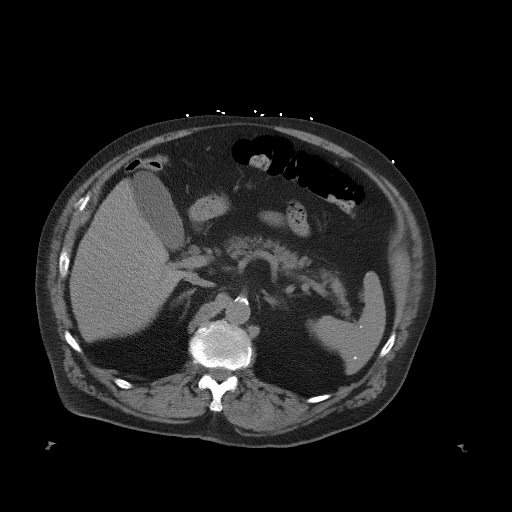
[im 83/99  soft-tissue]
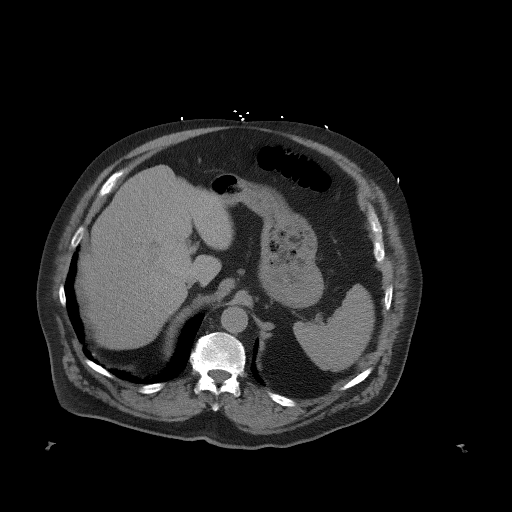
[im 93/99  soft-tissue]
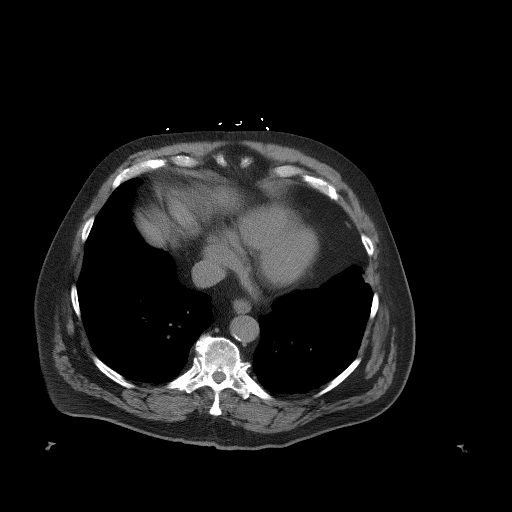

[Series 5: coronal st · coronal · 0.98mm/px · 3 of 144 slices shown]
[im 48/144  soft-tissue]
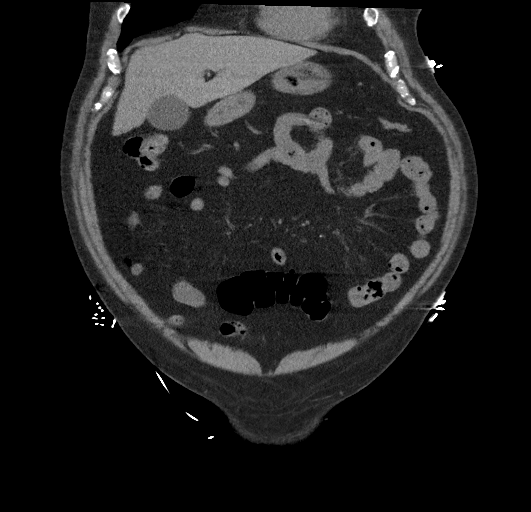
[im 64/144  soft-tissue]
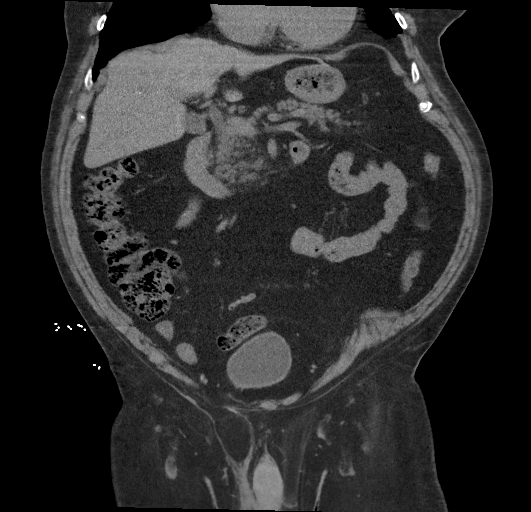
[im 80/144  soft-tissue]
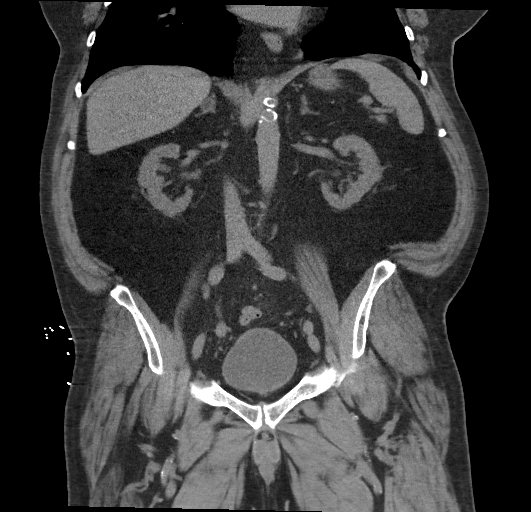

[16 of 46 positions shown; findings below may reference images not displayed]

FINDINGS: Lower chest: Calcified granuloma in the right lower lobe. There is a
tiny subpleural nodule in the right lower lobe, series 4, image 3.
No pleural fluid or focal airspace disease.

Hepatobiliary: Slight generalized increased hepatic density
consistent with amiodarone therapy. No focal hepatic abnormality.
Punctate calcified granuloma. Gallbladder physiologically distended,
no calcified stone. No biliary dilatation.

Pancreas: No ductal dilatation or inflammation. Mild fatty atrophy
of the pancreatic head.

Spleen: Normal in size without focal abnormality.

Adrenals/Urinary Tract: 14 mm low-density nodule in the right
adrenal gland consistent with adenoma. The left adrenal gland is
normal. There is bilateral renal parenchymal thinning. Symmetric
bilateral perinephric edema. No hydronephrosis. No renal calculi.
Suspect small cortical and parapelvic cysts in the left kidney. No
evidence of solid renal lesion. Both ureters are decompressed
without stones along the course. Urinary bladder is partially
distended. There is no bladder wall thickening. No bladder stone. No
obvious bladder mass.

Stomach/Bowel: Ingested material in the stomach. Normal positioning
of the duodenum and ligament of Treitz. There is no small bowel
obstruction or inflammatory change. Mild fecalization of distal
small bowel contents. Diminutive appendix tentatively visual. No
appendicitis. Small to moderate colonic stool burden. Mild
diverticulosis involving the descending and sigmoid colon. No
diverticulitis.

Vascular/Lymphatic: Aorto bi-iliac atherosclerosis. No aortic
aneurysm. No enlarged lymph nodes in the abdomen or pelvis. No
inguinal adenopathy.

Reproductive: Enlarged prostate gland spanning 5.7 x 4.5 x 5.7 cm
(volume = 77 cm^3). This causes mass effect on the bladder base.

Other: Moderate to large right and small left fat containing
inguinal hernia. No bowel involvement. No ascites. No free air.

Musculoskeletal: Degenerative change in the lumbar spine and both
hips. There are no acute or suspicious osseous abnormalities.
IMPRESSION: 1. No renal stones or obstructive uropathy. No explanation for
hematuria. Consider elective pre and postcontrast hematuria protocol
CT should symptoms persist.
2. Suspected small cortical and parapelvic cysts in the left kidney,
not well assessed in the absence of IV contrast.
3. Enlarged prostate gland causing mass effect on the bladder base.
Recommend correlation with PSA.
4. Moderate to large right and small left fat containing inguinal
hernia. No bowel involvement.
5. Mild colonic diverticulosis without diverticulitis.
6. Right adrenal adenoma.

Aortic Atherosclerosis (R26CH-RCA.A).

## 2021-01-03 IMAGING — DX DG CHEST 1V PORT
1 series · 1 of 1 positions shown · non-contrast
Comparison: None.

CLINICAL DATA: Shortness of breath

EXAM:
PORTABLE CHEST 1 VIEW

[chest ap grid]
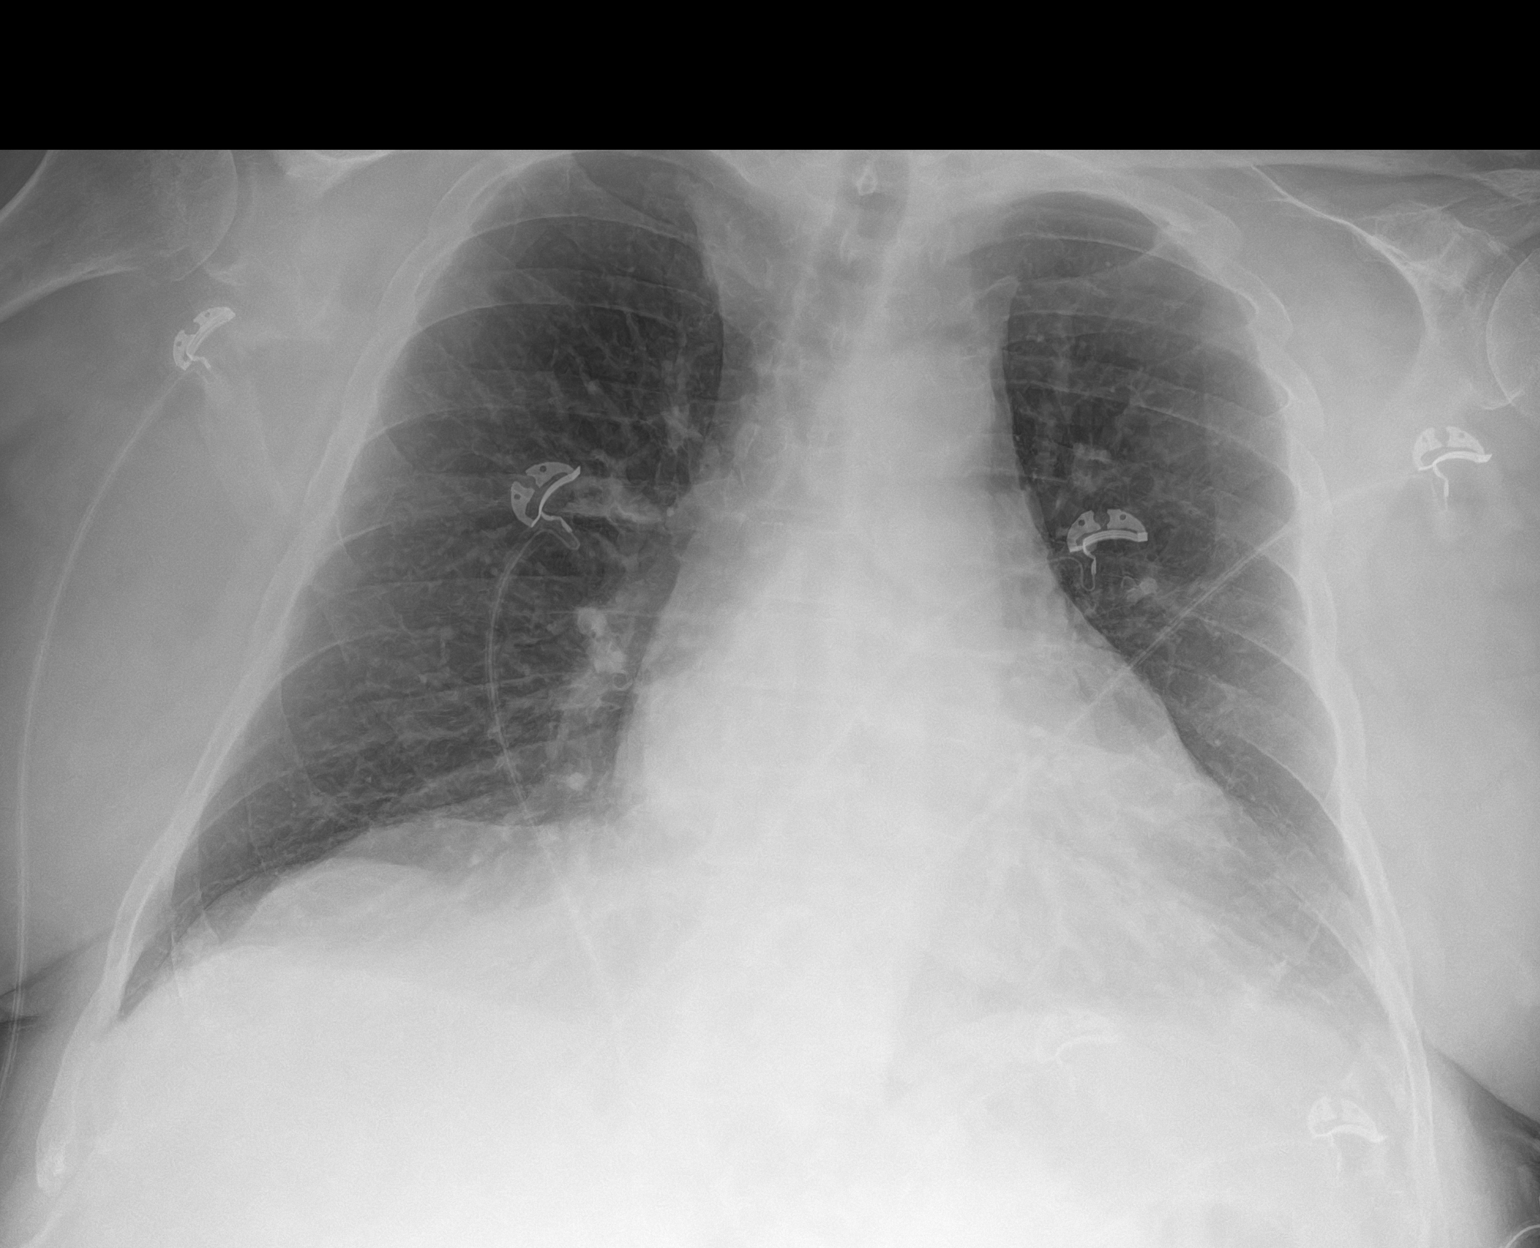

[1 of 1 positions shown; findings below may reference images not displayed]

FINDINGS: The heart size and mediastinal contours are within normal limits.
Both lungs are clear. The visualized skeletal structures are
unremarkable.
IMPRESSION: No active disease.

## 2021-02-05 ENCOUNTER — Ambulatory Visit: Payer: Medicare Other | Admitting: Urology

## 2021-02-07 DIAGNOSIS — E782 Mixed hyperlipidemia: Secondary | ICD-10-CM | POA: Insufficient documentation

## 2021-02-07 DIAGNOSIS — E1169 Type 2 diabetes mellitus with other specified complication: Secondary | ICD-10-CM | POA: Insufficient documentation

## 2021-02-07 DIAGNOSIS — I10 Essential (primary) hypertension: Secondary | ICD-10-CM | POA: Insufficient documentation

## 2021-02-08 DIAGNOSIS — R319 Hematuria, unspecified: Secondary | ICD-10-CM | POA: Diagnosis not present

## 2021-02-08 DIAGNOSIS — R682 Dry mouth, unspecified: Secondary | ICD-10-CM | POA: Diagnosis not present

## 2021-02-08 DIAGNOSIS — Z6837 Body mass index (BMI) 37.0-37.9, adult: Secondary | ICD-10-CM | POA: Diagnosis not present

## 2021-02-08 DIAGNOSIS — E782 Mixed hyperlipidemia: Secondary | ICD-10-CM | POA: Diagnosis not present

## 2021-02-08 DIAGNOSIS — I480 Paroxysmal atrial fibrillation: Secondary | ICD-10-CM | POA: Diagnosis not present

## 2021-02-08 DIAGNOSIS — R944 Abnormal results of kidney function studies: Secondary | ICD-10-CM | POA: Diagnosis not present

## 2021-02-08 DIAGNOSIS — I10 Essential (primary) hypertension: Secondary | ICD-10-CM | POA: Diagnosis not present

## 2021-02-08 DIAGNOSIS — E1169 Type 2 diabetes mellitus with other specified complication: Secondary | ICD-10-CM | POA: Diagnosis not present

## 2021-02-08 DIAGNOSIS — Z6834 Body mass index (BMI) 34.0-34.9, adult: Secondary | ICD-10-CM | POA: Diagnosis not present

## 2021-02-08 DIAGNOSIS — R7301 Impaired fasting glucose: Secondary | ICD-10-CM | POA: Diagnosis not present

## 2021-02-08 DIAGNOSIS — N1831 Chronic kidney disease, stage 3a: Secondary | ICD-10-CM | POA: Diagnosis not present

## 2021-02-08 DIAGNOSIS — E6609 Other obesity due to excess calories: Secondary | ICD-10-CM | POA: Diagnosis not present

## 2021-02-12 DIAGNOSIS — Z87448 Personal history of other diseases of urinary system: Secondary | ICD-10-CM | POA: Insufficient documentation

## 2021-02-12 DIAGNOSIS — L989 Disorder of the skin and subcutaneous tissue, unspecified: Secondary | ICD-10-CM | POA: Diagnosis not present

## 2021-02-12 DIAGNOSIS — N1831 Chronic kidney disease, stage 3a: Secondary | ICD-10-CM | POA: Insufficient documentation

## 2021-02-12 DIAGNOSIS — E782 Mixed hyperlipidemia: Secondary | ICD-10-CM | POA: Diagnosis not present

## 2021-02-12 DIAGNOSIS — I480 Paroxysmal atrial fibrillation: Secondary | ICD-10-CM | POA: Diagnosis not present

## 2021-02-12 DIAGNOSIS — I10 Essential (primary) hypertension: Secondary | ICD-10-CM | POA: Diagnosis not present

## 2021-02-12 DIAGNOSIS — M170 Bilateral primary osteoarthritis of knee: Secondary | ICD-10-CM | POA: Diagnosis not present

## 2021-02-12 DIAGNOSIS — E1169 Type 2 diabetes mellitus with other specified complication: Secondary | ICD-10-CM | POA: Diagnosis not present

## 2021-02-12 DIAGNOSIS — E875 Hyperkalemia: Secondary | ICD-10-CM | POA: Diagnosis not present

## 2021-03-26 ENCOUNTER — Other Ambulatory Visit: Payer: Self-pay

## 2021-03-26 ENCOUNTER — Ambulatory Visit (INDEPENDENT_AMBULATORY_CARE_PROVIDER_SITE_OTHER): Payer: Medicare Other | Admitting: Urology

## 2021-03-26 VITALS — BP 193/97 | HR 88

## 2021-03-26 DIAGNOSIS — R319 Hematuria, unspecified: Secondary | ICD-10-CM

## 2021-03-26 DIAGNOSIS — N400 Enlarged prostate without lower urinary tract symptoms: Secondary | ICD-10-CM

## 2021-03-26 LAB — URINALYSIS, ROUTINE W REFLEX MICROSCOPIC
Bilirubin, UA: NEGATIVE
Glucose, UA: NEGATIVE
Ketones, UA: NEGATIVE
Leukocytes,UA: NEGATIVE
Nitrite, UA: NEGATIVE
Protein,UA: NEGATIVE
Specific Gravity, UA: 1.02 (ref 1.005–1.030)
Urobilinogen, Ur: 0.2 mg/dL (ref 0.2–1.0)
pH, UA: 5.5 (ref 5.0–7.5)

## 2021-03-26 LAB — MICROSCOPIC EXAMINATION
Bacteria, UA: NONE SEEN
RBC, Urine: >30 /hpf — AB (ref 0–2)
Renal Epithel, UA: NONE SEEN /hpf
WBC, UA: NONE SEEN /hpf (ref 0–5)

## 2021-03-26 NOTE — Progress Notes (Signed)
History of Present Illness: 80 year old male returns for routine follow-up.  Underwent hematuria evaluation for gross painless hematuria in December 2021.  Upper tracts were unremarkable on CT hematuria protocol.  Cystoscopy was negative.  Urinary cytology was negative.  He is on Eliquis.  Within the past 3 days he had 3 episodes of gross painless hematuria, he thinks total in nature.  No change in urinary pattern.  Currently his urine is clear.  Past Medical History:  Diagnosis Date   Essential hypertension    Renal insufficiency    Possible history    Past Surgical History:  Procedure Laterality Date   HERNIA REPAIR      Home Medications:  Allergies as of 03/26/2021   No Known Allergies      Medication List        Accurate as of March 26, 2021  3:04 PM. If you have any questions, ask your nurse or doctor.          acetaminophen 500 MG tablet Commonly known as: TYLENOL Take 500-1,000 mg by mouth as needed.   amLODipine 5 MG tablet Commonly known as: NORVASC Take 1 tablet (5 mg total) by mouth daily.   apixaban 5 MG Tabs tablet Commonly known as: ELIQUIS Take 1 tablet (5 mg total) by mouth 2 (two) times daily.   losartan 100 MG tablet Commonly known as: COZAAR Take 1 tablet by mouth daily.   meloxicam 7.5 MG tablet Commonly known as: MOBIC Take 7.5 mg by mouth daily.   OSTEO BI-FLEX ONE PER DAY PO Take 1 tablet by mouth daily.   pravastatin 40 MG tablet Commonly known as: PRAVACHOL Take 40 mg by mouth daily.        Allergies: No Known Allergies  Family History  Problem Relation Age of Onset   Hypertension Father     Social History:  reports that he has never smoked. He has never used smokeless tobacco. He reports current alcohol use. He reports that he does not use drugs.  ROS: A complete review of systems was performed.  All systems are negative except for pertinent findings as noted.  Physical Exam:  Vital signs in last 24 hours: BP (!)  193/97   Pulse 88  Constitutional:  Alert and oriented, No acute distress Cardiovascular: Regular rate  Respiratory: Normal respiratory effort Neurologic: Grossly intact, no focal deficits Psychiatric: Normal mood and affect  I have reviewed prior pt notes  I have reviewed urinalysis results  I have independently reviewed prior imaging--CT images once again reviewed  PSA in August, 2021 0.82  I have reviewed prior urine cytology   Impression/Assessment:  Recurrent gross painless hematuria with negative evaluation in late 2021.  Recurrent episode recently.  He is on Eliquis  Plan:  More than likely, his hematuria secondary to prostate source  The patient was reassured  I will see him back in about 3 months to recheck.  If this occurs more frequently, consider finasteride

## 2021-03-26 NOTE — Progress Notes (Signed)
Urological Symptom Review  Patient is experiencing the following symptoms: Blood in urine   Review of Systems  Gastrointestinal (upper)  : Negative for upper GI symptoms  Gastrointestinal (lower) : Negative for lower GI symptoms  Constitutional : Negative for symptoms  Skin: Negative for skin symptoms  Eyes: Negative for eye symptoms  Ear/Nose/Throat : Negative for Ear/Nose/Throat symptoms  Hematologic/Lymphatic: Negative for Hematologic/Lymphatic symptoms  Cardiovascular : Negative for cardiovascular symptoms  Respiratory : Negative for respiratory symptoms  Endocrine: Negative for endocrine symptoms  Musculoskeletal: Negative for musculoskeletal symptoms  Neurological: Negative for neurological symptoms  Psychologic: Negative for psychiatric symptoms  

## 2021-05-15 DIAGNOSIS — E1169 Type 2 diabetes mellitus with other specified complication: Secondary | ICD-10-CM | POA: Diagnosis not present

## 2021-05-17 DIAGNOSIS — M170 Bilateral primary osteoarthritis of knee: Secondary | ICD-10-CM | POA: Diagnosis not present

## 2021-05-17 DIAGNOSIS — E875 Hyperkalemia: Secondary | ICD-10-CM | POA: Diagnosis not present

## 2021-05-17 DIAGNOSIS — I10 Essential (primary) hypertension: Secondary | ICD-10-CM | POA: Diagnosis not present

## 2021-05-17 DIAGNOSIS — Z0001 Encounter for general adult medical examination with abnormal findings: Secondary | ICD-10-CM | POA: Diagnosis not present

## 2021-05-17 DIAGNOSIS — N1831 Chronic kidney disease, stage 3a: Secondary | ICD-10-CM | POA: Diagnosis not present

## 2021-05-17 DIAGNOSIS — Z87448 Personal history of other diseases of urinary system: Secondary | ICD-10-CM | POA: Diagnosis not present

## 2021-05-17 DIAGNOSIS — E782 Mixed hyperlipidemia: Secondary | ICD-10-CM | POA: Diagnosis not present

## 2021-05-17 DIAGNOSIS — L989 Disorder of the skin and subcutaneous tissue, unspecified: Secondary | ICD-10-CM | POA: Diagnosis not present

## 2021-05-17 DIAGNOSIS — I480 Paroxysmal atrial fibrillation: Secondary | ICD-10-CM | POA: Diagnosis not present

## 2021-05-17 DIAGNOSIS — E1169 Type 2 diabetes mellitus with other specified complication: Secondary | ICD-10-CM | POA: Diagnosis not present

## 2021-06-25 ENCOUNTER — Other Ambulatory Visit: Payer: Self-pay

## 2021-06-25 ENCOUNTER — Encounter: Payer: Self-pay | Admitting: Urology

## 2021-06-25 ENCOUNTER — Ambulatory Visit (INDEPENDENT_AMBULATORY_CARE_PROVIDER_SITE_OTHER): Payer: Medicare Other | Admitting: Urology

## 2021-06-25 VITALS — Wt 243.0 lb

## 2021-06-25 DIAGNOSIS — R319 Hematuria, unspecified: Secondary | ICD-10-CM | POA: Diagnosis not present

## 2021-06-25 DIAGNOSIS — N400 Enlarged prostate without lower urinary tract symptoms: Secondary | ICD-10-CM

## 2021-06-25 NOTE — Progress Notes (Signed)

## 2021-06-25 NOTE — Progress Notes (Signed)
History of Present Illness:   Underwent hematuria evaluation for gross painless hematuria in December 2021.  Upper tracts were unremarkable on CT hematuria protocol.  Cystoscopy was negative.  Urinary cytology was negative.  He is on Eliquis.  7.26.2022:Within the past 3 days he had 3 episodes of gross painless hematuria, he thinks total in nature.  No change in urinary pattern.  Currently his urine is clear. Past Medical History:  Diagnosis Date   Essential hypertension    Renal insufficiency    Possible history    Past Surgical History:  Procedure Laterality Date   HERNIA REPAIR      Home Medications:  Allergies as of 06/25/2021   No Known Allergies      Medication List        Accurate as of June 25, 2021 12:18 PM. If you have any questions, ask your nurse or doctor.          acetaminophen 500 MG tablet Commonly known as: TYLENOL Take 500-1,000 mg by mouth as needed.   amLODipine 5 MG tablet Commonly known as: NORVASC Take 1 tablet (5 mg total) by mouth daily.   apixaban 5 MG Tabs tablet Commonly known as: ELIQUIS Take 1 tablet (5 mg total) by mouth 2 (two) times daily.   losartan 100 MG tablet Commonly known as: COZAAR Take 1 tablet by mouth daily.   meloxicam 7.5 MG tablet Commonly known as: MOBIC Take 7.5 mg by mouth daily.   OSTEO BI-FLEX ONE PER DAY PO Take 1 tablet by mouth daily.   pravastatin 40 MG tablet Commonly known as: PRAVACHOL Take 40 mg by mouth daily.        Allergies: No Known Allergies  Family History  Problem Relation Age of Onset   Hypertension Father     Social History:  reports that he has never smoked. He has never used smokeless tobacco. He reports current alcohol use. He reports that he does not use drugs.  ROS: A complete review of systems was performed.  All systems are negative except for pertinent findings as noted.  Physical Exam:  Vital signs in last 24 hours: There were no vitals taken for this  visit. Constitutional:  Alert and oriented, No acute distress Cardiovascular: Regular rate  Respiratory: Normal respiratory effort GI: Abdomen is soft, nontender, nondistended, no abdominal masses. No CVAT.  Genitourinary: Normal male phallus, testes are descended bilaterally and non-tender and without masses, scrotum is normal in appearance without lesions or masses, perineum is normal on inspection. Lymphatic: No lymphadenopathy Neurologic: Grossly intact, no focal deficits Psychiatric: Normal mood and affect  I have reviewed prior pt notes  I have reviewed notes from referring/previous physicians  I have reviewed urinalysis results    Impression/Assessment:  Gross hematuria with negative evaluation, currently dormant  Plan:  I will have him come back on a yearly basis at his request.  He knows to come back sooner if he has more episodes of gross hematuria.  If so, we will consider putting him on finasteride

## 2021-06-26 LAB — MICROSCOPIC EXAMINATION: Renal Epithel, UA: NONE SEEN /hpf

## 2021-06-26 LAB — URINALYSIS, ROUTINE W REFLEX MICROSCOPIC
Bilirubin, UA: NEGATIVE
Glucose, UA: NEGATIVE
Ketones, UA: NEGATIVE
Leukocytes,UA: NEGATIVE
Nitrite, UA: NEGATIVE
Specific Gravity, UA: 1.025 (ref 1.005–1.030)
Urobilinogen, Ur: 0.2 mg/dL (ref 0.2–1.0)
pH, UA: 5.5 (ref 5.0–7.5)

## 2021-06-27 ENCOUNTER — Encounter: Payer: Self-pay | Admitting: Cardiology

## 2021-06-27 ENCOUNTER — Ambulatory Visit (INDEPENDENT_AMBULATORY_CARE_PROVIDER_SITE_OTHER): Payer: Medicare Other | Admitting: Cardiology

## 2021-06-27 ENCOUNTER — Other Ambulatory Visit: Payer: Self-pay

## 2021-06-27 VITALS — BP 152/92 | HR 106 | Ht 63.0 in | Wt 240.8 lb

## 2021-06-27 DIAGNOSIS — I429 Cardiomyopathy, unspecified: Secondary | ICD-10-CM | POA: Diagnosis not present

## 2021-06-27 DIAGNOSIS — I10 Essential (primary) hypertension: Secondary | ICD-10-CM | POA: Diagnosis not present

## 2021-06-27 DIAGNOSIS — I4819 Other persistent atrial fibrillation: Secondary | ICD-10-CM

## 2021-06-27 DIAGNOSIS — I480 Paroxysmal atrial fibrillation: Secondary | ICD-10-CM

## 2021-06-27 MED ORDER — BISOPROLOL FUMARATE 5 MG PO TABS: 2.5000 mg | ORAL_TABLET | Freq: Every day | ORAL | 3 refills | Status: DC

## 2021-06-27 NOTE — Patient Instructions (Addendum)
Medication Instructions:  Your physician has recommended you make the following change in your medication:  Start bisoprolol 2.5 mg daily Continue other medications the same  Labwork: none  Testing/Procedures: Your physician has requested that you have an echocardiogram in 3 months just before your next visit. Echocardiography is a painless test that uses sound waves to create images of your heart. It provides your doctor with information about the size and shape of your heart and how well your heart's chambers and valves are working. This procedure takes approximately one hour. There are no restrictions for this procedure.  Follow-Up: Your physician recommends that you schedule a follow-up appointment in: 3 months in [place].  Any Other Special Instructions Will Be Listed Below (If Applicable).  If you need a refill on your cardiac medications before your next appointment, please call your pharmacy.

## 2021-06-27 NOTE — Progress Notes (Signed)
Cardiology Office Note  Date: 06/27/2021   ID: Kevin Rios, DOB 12-17-1940, MRN [id number]  PCP:  Celene Squibb, MD  Cardiologist:  Rozann Lesches, MD Electrophysiologist:  None   Chief Complaint  Patient presents with   Cardiac follow-up     History of Present Illness: Kevin Rios is a 80 y.o. male last seen in April by Mr. Leonides Sake NP.  I reviewed his records and updated the chart.  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  Arrhythmia and cardiomyopathy were documented during hospitalization back in August 2021 with UTI and associated hematuria.  He is here for follow-up today.  Reports no sense of palpitations or chest pain.  NYHA class II dyspnea in general.  His medicines have been modified by PCP mainly aimed at blood pressure control.  He states that he has been eating [place] fries recently with increased sodium intake and his blood pressure has been higher.  I personally reviewed his ECG today which shows atrial fibrillation at 106 bpm.  He apparently did not tolerate Toprol-XL and this was discontinued several months ago.   Past Medical History:  Diagnosis Date   Aortic atherosclerosis (HCC)    Atrial fibrillation ([place])    Cardiomyopathy ([place])    Essential hypertension    Renal insufficiency    Possible history    Past Surgical History:  Procedure Laterality Date   HERNIA REPAIR      Current Outpatient Medications  Medication Sig Dispense Refill   acetaminophen (TYLENOL) 500 MG tablet Take 500-1,000 mg by mouth as needed.     amLODipine (NORVASC) 5 MG tablet Take 1 tablet (5 mg total) by mouth daily. 30 tablet 6   apixaban (ELIQUIS) 5 MG TABS tablet Take 1 tablet (5 mg total) by mouth 2 (two) times daily. 60 tablet 6   bisoprolol (ZEBETA) 5 MG tablet Take 0.5 tablets (2.5 mg total) by mouth daily. 15 tablet 3   Boswellia-Glucosamine-Vit D (OSTEO BI-FLEX ONE PER DAY  PO) Take 1 tablet by mouth daily.     cloNIDine (CATAPRES) 0.2 MG tablet Take 0.2 mg by mouth 2 (two) times daily.     losartan (COZAAR) 100 MG tablet Take 1 tablet by mouth daily.     meloxicam (MOBIC) 7.5 MG tablet Take 7.5 mg by mouth daily.      pravastatin (PRAVACHOL) 40 MG tablet Take 40 mg by mouth daily.     No current facility-administered medications for this visit.   Allergies:  Patient has no known allergies.   ROS: No orthopnea or PND.  Physical Exam: VS:  BP (!) 152/92   Pulse (!) 106   Ht 5\' 3"  (1.6 m)   Wt 240 lb 12.8 oz (109.2 kg)   SpO2 92%   BMI 42.66 kg/m , BMI Body mass index is 42.66 kg/m.  Wt Readings from Last 3 Encounters:  06/27/21 240 lb 12.8 oz (109.2 kg)  06/25/21 243 lb (110.2 kg)  12/07/20 243 lb 6.4 oz (110.4 kg)    General: Patient appears comfortable at rest.  Wearing a construction hard hat. HEENT: Conjunctiva and lids normal, wearing a mask. Neck: Supple, no elevated JVP or carotid bruits, no thyromegaly. Lungs: Clear to auscultation, nonlabored breathing at rest. Cardiac: Irregularly irregular, no S3 or significant systolic murmur. Extremities: No pitting edema.  ECG:  An ECG dated 04/17/2020 was personally reviewed today and demonstrated:  Atrial fibrillation  with low voltage and poor R wave progression.  Recent Labwork: 08/03/2020: Creatinine, Ser 1.50   Other Studies Reviewed Today:  Echocardiogram 04/16/2020:  1. Left ventricular ejection fraction, by estimation, is 40 to 45%. The  left ventricle has mildly decreased function. The left ventricle  demonstrates global hypokinesis. There is mild left ventricular  hypertrophy. Left ventricular diastolic parameters  are indeterminate in the setting of atrial fibrillation.   2. Right ventricular systolic function is moderately reduced. The right  ventricular size is normal. There is normal pulmonary artery systolic  pressure. The estimated right ventricular systolic pressure is 80.1  mmHg.   3. Left atrial size was mildly dilated.   4. The mitral valve is grossly normal. Trivial mitral valve  regurgitation.   5. The aortic valve is tricuspid. Aortic valve regurgitation is not  visualized.   6. The inferior vena cava is normal in size with greater than 50%  respiratory variability, suggesting right atrial pressure of 3 mmHg.   Assessment and Plan:  1.  Persistent/permanent atrial fibrillation with CHA2DS2-VASc score of 5.  He is on Eliquis for stroke prophylaxis, reports no spontaneous bleeding problems.  Has been following with Dr. Nevada Crane for lab work.  Plan to start bisoprolol 2.5 mg daily for better heart rate control, this can be uptitrated as tolerated.  He did not tolerate Toprol-XL previously.  2.  Secondary cardiomyopathy, possibly tachycardia-mediated.  Bisoprolol being added as discussed above.  Otherwise continue losartan.  Plan to follow-up echocardiogram with clinical visit in 3 months.  LVEF 40 to 45% as of August 2021.  3.  Essential hypertension, currently on losartan, clonidine, and Norvasc.  Bisoprolol being added mainly for heart rate control but may also contribute to blood pressure control.  Medication Adjustments/Labs and Tests Ordered: Current medicines are reviewed at length with the patient today.  Concerns regarding medicines are outlined above.   Tests Ordered: Orders Placed This Encounter  Procedures   EKG 12-Lead   ECHOCARDIOGRAM COMPLETE     Medication Changes: Meds ordered this encounter  Medications   bisoprolol (ZEBETA) 5 MG tablet    Sig: Take 0.5 tablets (2.5 mg total) by mouth daily.    Dispense:  15 tablet    Refill:  3    06/27/2021 NEW     Disposition:  Follow up  3 months in the [place] office.  Signed, Satira Sark, MD, [facility] 06/27/2021 4:04 PM    [facility] [place], [place], [place] [address] Phone: [phone]; Fax: [phone]

## 2021-09-09 DIAGNOSIS — E1169 Type 2 diabetes mellitus with other specified complication: Secondary | ICD-10-CM | POA: Diagnosis not present

## 2021-09-12 DIAGNOSIS — M170 Bilateral primary osteoarthritis of knee: Secondary | ICD-10-CM | POA: Diagnosis not present

## 2021-09-12 DIAGNOSIS — L989 Disorder of the skin and subcutaneous tissue, unspecified: Secondary | ICD-10-CM | POA: Diagnosis not present

## 2021-09-12 DIAGNOSIS — Z87448 Personal history of other diseases of urinary system: Secondary | ICD-10-CM | POA: Diagnosis not present

## 2021-09-12 DIAGNOSIS — E782 Mixed hyperlipidemia: Secondary | ICD-10-CM | POA: Diagnosis not present

## 2021-09-12 DIAGNOSIS — E875 Hyperkalemia: Secondary | ICD-10-CM | POA: Diagnosis not present

## 2021-09-12 DIAGNOSIS — I480 Paroxysmal atrial fibrillation: Secondary | ICD-10-CM | POA: Diagnosis not present

## 2021-09-12 DIAGNOSIS — I10 Essential (primary) hypertension: Secondary | ICD-10-CM | POA: Diagnosis not present

## 2021-09-12 DIAGNOSIS — N1831 Chronic kidney disease, stage 3a: Secondary | ICD-10-CM | POA: Diagnosis not present

## 2021-09-12 DIAGNOSIS — E1169 Type 2 diabetes mellitus with other specified complication: Secondary | ICD-10-CM | POA: Diagnosis not present

## 2021-09-12 DIAGNOSIS — R809 Proteinuria, unspecified: Secondary | ICD-10-CM | POA: Diagnosis not present

## 2021-09-18 ENCOUNTER — Other Ambulatory Visit: Payer: Self-pay | Admitting: Cardiology

## 2021-09-26 ENCOUNTER — Ambulatory Visit (INDEPENDENT_AMBULATORY_CARE_PROVIDER_SITE_OTHER): Payer: Medicare Other

## 2021-09-26 DIAGNOSIS — I429 Cardiomyopathy, unspecified: Secondary | ICD-10-CM

## 2021-09-26 DIAGNOSIS — I480 Paroxysmal atrial fibrillation: Secondary | ICD-10-CM | POA: Diagnosis not present

## 2021-09-26 LAB — ECHOCARDIOGRAM COMPLETE
AR max vel: 2.31 cm2
AV Area VTI: 2.83 cm2
AV Area mean vel: 2.26 cm2
AV Mean grad: 5 mmHg
AV Peak grad: 8.2 mmHg
Ao pk vel: 1.43 m/s
Area-P 1/2: 5.06 cm2
Calc EF: 60.1 %
S' Lateral: 3.19 cm
Single Plane A2C EF: 60.6 %
Single Plane A4C EF: 57.9 %

## 2021-09-27 ENCOUNTER — Telehealth: Payer: Self-pay | Admitting: Cardiology

## 2021-09-27 NOTE — Telephone Encounter (Signed)
Kevin Sark, MD  09/26/2021  4:52 PM EST     Results reviewed.  LVEF has now normalized at 60 to 65%.  Would continue with current medical therapy and follow-up plan.

## 2021-09-27 NOTE — Telephone Encounter (Signed)
Patient returned call for test results.  °

## 2021-09-27 NOTE — Telephone Encounter (Signed)
I spoke with patient and discussed echo results, copied pcp.

## 2021-10-02 ENCOUNTER — Ambulatory Visit: Payer: Medicare Other | Admitting: Student

## 2021-10-08 ENCOUNTER — Telehealth: Payer: Self-pay | Admitting: Cardiology

## 2021-10-08 MED ORDER — AMLODIPINE BESYLATE 5 MG PO TABS: 5.0000 mg | ORAL_TABLET | Freq: Every day | ORAL | 6 refills | Status: DC

## 2021-10-08 NOTE — Telephone Encounter (Signed)
Done

## 2021-10-08 NOTE — Telephone Encounter (Signed)
°*  STAT* If patient is at the pharmacy, call can be transferred to refill team.   1. Which medications need to be refilled? (please list name of each medication and dose if known) amLODipine (NORVASC) 5 MG tablet  2. Which pharmacy/location (including street and city if local pharmacy) is medication to be sent to? [place], [address]  3. Do they need a 30 day or 90 day supply? 30 day   Patient is completely out of medication.

## 2021-12-19 ENCOUNTER — Encounter: Payer: Self-pay | Admitting: Cardiology

## 2021-12-19 ENCOUNTER — Ambulatory Visit (INDEPENDENT_AMBULATORY_CARE_PROVIDER_SITE_OTHER): Payer: Medicare Other | Admitting: Cardiology

## 2021-12-19 VITALS — BP 162/70 | HR 92 | Wt 267.8 lb

## 2021-12-19 DIAGNOSIS — I429 Cardiomyopathy, unspecified: Secondary | ICD-10-CM | POA: Diagnosis not present

## 2021-12-19 DIAGNOSIS — I4821 Permanent atrial fibrillation: Secondary | ICD-10-CM

## 2021-12-19 DIAGNOSIS — I10 Essential (primary) hypertension: Secondary | ICD-10-CM

## 2021-12-19 MED ORDER — BISOPROLOL FUMARATE 5 MG PO TABS: 5.0000 mg | ORAL_TABLET | Freq: Every day | ORAL | 6 refills | Status: DC

## 2021-12-19 NOTE — Progress Notes (Signed)
? ? ?Cardiology Office Note ? ?Date: 12/19/2021  ? ?ID: Kevin Rios, DOB 07-28-1941, MRN [id number] ? ?PCP:  Celene Squibb, MD  ?Cardiologist:  Rozann Lesches, MD ?Electrophysiologist:  None  ? ?Chief Complaint  ?Patient presents with  ? Cardiac follow-up  ? ? ?History of Present Illness: ?Kevin Rios is a 80 y.o. male last seen in October 2022.  He is here for a follow-up visit.  States that he has continued to gain weight although has been trying to cut back some on carbohydrates and his diet. ? ?At the last visit we started low-dose bisoprolol for better heart rate control given prior reported intolerance to Toprol-XL.  It sounds like he did start the medication although was taking 5 mg daily, eventually was told to stop it by his PCP, not entirely clear why as it sounds like he was tolerating it.  Resting heart rate is in the mid 90s today.  He has otherwise continued on Eliquis for stroke prophylaxis without spontaneous bleeding problems. ? ?Follow-up echocardiogram in January revealed normalization of LVEF in the range of 60 to 65%. ? ?Past Medical History:  ?Diagnosis Date  ? Aortic atherosclerosis ([place])   ? Atrial fibrillation ([place])   ? Cardiomyopathy ([place])   ? Essential hypertension   ? Renal insufficiency   ? Possible history  ? ? ?Past Surgical History:  ?Procedure Laterality Date  ? HERNIA REPAIR    ? ? ?Current Outpatient Medications  ?Medication Sig Dispense Refill  ? amLODipine (NORVASC) 5 MG tablet Take 1 tablet (5 mg total) by mouth daily. 30 tablet 6  ? apixaban (ELIQUIS) 5 MG TABS tablet Take 1 tablet (5 mg total) by mouth 2 (two) times daily. 60 tablet 6  ? bisoprolol (ZEBETA) 5 MG tablet Take 1 tablet (5 mg total) by mouth daily. 30 tablet 6  ? cloNIDine (CATAPRES) 0.2 MG tablet Take 0.2 mg by mouth 2 (two) times daily.    ? losartan (COZAAR) 100 MG tablet Take 1 tablet by mouth daily.    ? meloxicam (MOBIC) 7.5 MG tablet Take 7.5 mg by mouth daily.     ? pravastatin (PRAVACHOL) 40 MG  tablet Take 40 mg by mouth daily.    ? acetaminophen (TYLENOL) 500 MG tablet Take 500-1,000 mg by mouth as needed.    ? Boswellia-Glucosamine-Vit D (OSTEO BI-FLEX ONE PER DAY PO) Take 1 tablet by mouth daily.    ? ?No current facility-administered medications for this visit.  ? ?Allergies:  Patient has no known allergies.  ? ?ROS: No orthopnea or PND. ? ?Physical Exam: ?VS:  BP (!) 162/70   Pulse 92   Wt 267 lb 12.8 oz (121.5 kg)   SpO2 97%   BMI 47.44 kg/m? , BMI Body mass index is 47.44 kg/m?. ? ?Wt Readings from Last 3 Encounters:  ?12/19/21 267 lb 12.8 oz (121.5 kg)  ?06/27/21 240 lb 12.8 oz (109.2 kg)  ?06/25/21 243 lb (110.2 kg)  ?  ?General: Patient appears comfortable at rest. ?HEENT: Conjunctiva and lids normal. ?Neck: Supple, no elevated JVP or carotid bruits, no thyromegaly. ?Lungs: Clear to auscultation, nonlabored breathing at rest. ?Cardiac: Irregularly irregular, no S3 or significant systolic murmur. ?Extremities: No pitting edema. ? ?ECG:  An ECG dated 06/27/2021 was personally reviewed today and demonstrated:  Atrial fibrillation. ? ?Recent Labwork: ? ?January 2023: Hemoglobin A1c 6.8%, cholesterol 148, triglycerides 69, HDL 56, LDL 78, BUN 17, creatinine 1.27, potassium 5.3, AST 18, ALT 17, hemoglobin 14.3, platelets  252 ? ?Other Studies Reviewed Today: ? ?Echocardiogram 09/26/2021: ? 1. Left ventricular ejection fraction, by estimation, is 60 to 65%. The  ?left ventricle has normal function. The left ventricle has no regional  ?wall motion abnormalities. Left ventricular diastolic parameters are  ?indeterminate.  ? 2. Right ventricular systolic function is normal. The right ventricular  ?size is normal.  ? 3. Left atrial size was mildly dilated.  ? 4. The mitral valve is abnormal. Mild mitral valve regurgitation. No  ?evidence of mitral stenosis.  ? 5. The tricuspid valve is abnormal.  ? 6. The aortic valve is tricuspid. There is mild calcification of the  ?aortic valve. There is mild  thickening of the aortic valve. Aortic valve  ?regurgitation is not visualized. No aortic stenosis is present.  ? ?Assessment and Plan: ? ?1.  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  He remains on Eliquis for stroke prophylaxis.  We discussed heart rate control and we will plan to resume bisoprolol although at 5 mg daily which she had apparently been tolerating for a few months prior to it being stopped.  This will also help with his blood pressure hopefully. ? ?2.  Essential hypertension, he is on Norvasc, clonidine, and Cozaar.  Blood pressure elevated today.  Bisoprolol is being added as discussed above. ? ?3.  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  Follow-up echocardiogram in January shows normal LVEF at 60 to 65%. ? ?Medication Adjustments/Labs and Tests Ordered: ?Current medicines are reviewed at length with the patient today.  Concerns regarding medicines are outlined above.  ? ?Tests Ordered: ?No orders of the defined types were placed in this encounter. ? ? ?Medication Changes: ?Meds ordered this encounter  ?Medications  ? bisoprolol (ZEBETA) 5 MG tablet  ?  Sig: Take 1 tablet (5 mg total) by mouth daily.  ?  Dispense:  30 tablet  ?  Refill:  6  ? ? ?Disposition:  Follow up  6 months. ? ?Signed, ?Satira Sark, MD, [facility] ?12/19/2021 4:53 PM    ?[facility] ?[place], [place], [place] [address] ?Phone: [phone]; Fax: [phone]  ?

## 2021-12-19 NOTE — Patient Instructions (Addendum)
Medication Instructions:  ?Your physician has recommended you make the following change in your medication:  ?Change bisoprolol to 5 mg daily ?Continue other medications the same ? ?Labwork: ?none ? ?Testing/Procedures: ?none ? ?Follow-Up: ?Your physician recommends that you schedule a follow-up appointment in: 6 months ? ?Any Other Special Instructions Will Be Listed Below (If Applicable). ? ?If you need a refill on your cardiac medications before your next appointment, please call your pharmacy. ?

## 2022-01-09 DIAGNOSIS — E782 Mixed hyperlipidemia: Secondary | ICD-10-CM | POA: Diagnosis not present

## 2022-01-09 DIAGNOSIS — E1169 Type 2 diabetes mellitus with other specified complication: Secondary | ICD-10-CM | POA: Diagnosis not present

## 2022-01-16 DIAGNOSIS — Z87448 Personal history of other diseases of urinary system: Secondary | ICD-10-CM | POA: Diagnosis not present

## 2022-01-16 DIAGNOSIS — E875 Hyperkalemia: Secondary | ICD-10-CM | POA: Diagnosis not present

## 2022-01-16 DIAGNOSIS — L989 Disorder of the skin and subcutaneous tissue, unspecified: Secondary | ICD-10-CM | POA: Diagnosis not present

## 2022-01-16 DIAGNOSIS — E782 Mixed hyperlipidemia: Secondary | ICD-10-CM | POA: Diagnosis not present

## 2022-01-16 DIAGNOSIS — N1831 Chronic kidney disease, stage 3a: Secondary | ICD-10-CM | POA: Diagnosis not present

## 2022-01-16 DIAGNOSIS — H9193 Unspecified hearing loss, bilateral: Secondary | ICD-10-CM | POA: Diagnosis not present

## 2022-01-16 DIAGNOSIS — M170 Bilateral primary osteoarthritis of knee: Secondary | ICD-10-CM | POA: Diagnosis not present

## 2022-01-16 DIAGNOSIS — I10 Essential (primary) hypertension: Secondary | ICD-10-CM | POA: Diagnosis not present

## 2022-01-16 DIAGNOSIS — I480 Paroxysmal atrial fibrillation: Secondary | ICD-10-CM | POA: Diagnosis not present

## 2022-01-16 DIAGNOSIS — E1169 Type 2 diabetes mellitus with other specified complication: Secondary | ICD-10-CM | POA: Diagnosis not present

## 2022-01-16 DIAGNOSIS — R809 Proteinuria, unspecified: Secondary | ICD-10-CM | POA: Diagnosis not present

## 2022-04-23 DIAGNOSIS — E1169 Type 2 diabetes mellitus with other specified complication: Secondary | ICD-10-CM | POA: Diagnosis not present

## 2022-04-23 DIAGNOSIS — E782 Mixed hyperlipidemia: Secondary | ICD-10-CM | POA: Diagnosis not present

## 2022-04-30 DIAGNOSIS — N1831 Chronic kidney disease, stage 3a: Secondary | ICD-10-CM | POA: Diagnosis not present

## 2022-04-30 DIAGNOSIS — E782 Mixed hyperlipidemia: Secondary | ICD-10-CM | POA: Diagnosis not present

## 2022-04-30 DIAGNOSIS — L989 Disorder of the skin and subcutaneous tissue, unspecified: Secondary | ICD-10-CM | POA: Diagnosis not present

## 2022-04-30 DIAGNOSIS — E875 Hyperkalemia: Secondary | ICD-10-CM | POA: Diagnosis not present

## 2022-04-30 DIAGNOSIS — R600 Localized edema: Secondary | ICD-10-CM | POA: Diagnosis not present

## 2022-04-30 DIAGNOSIS — E1169 Type 2 diabetes mellitus with other specified complication: Secondary | ICD-10-CM | POA: Diagnosis not present

## 2022-04-30 DIAGNOSIS — I10 Essential (primary) hypertension: Secondary | ICD-10-CM | POA: Diagnosis not present

## 2022-04-30 DIAGNOSIS — H9193 Unspecified hearing loss, bilateral: Secondary | ICD-10-CM | POA: Insufficient documentation

## 2022-04-30 DIAGNOSIS — M170 Bilateral primary osteoarthritis of knee: Secondary | ICD-10-CM | POA: Diagnosis not present

## 2022-04-30 DIAGNOSIS — I480 Paroxysmal atrial fibrillation: Secondary | ICD-10-CM | POA: Diagnosis not present

## 2022-04-30 DIAGNOSIS — Z0001 Encounter for general adult medical examination with abnormal findings: Secondary | ICD-10-CM | POA: Diagnosis not present

## 2022-04-30 DIAGNOSIS — Z87448 Personal history of other diseases of urinary system: Secondary | ICD-10-CM | POA: Diagnosis not present

## 2022-06-04 DIAGNOSIS — R600 Localized edema: Secondary | ICD-10-CM | POA: Diagnosis not present

## 2022-06-04 DIAGNOSIS — I480 Paroxysmal atrial fibrillation: Secondary | ICD-10-CM | POA: Diagnosis not present

## 2022-06-04 DIAGNOSIS — I10 Essential (primary) hypertension: Secondary | ICD-10-CM | POA: Diagnosis not present

## 2022-06-04 DIAGNOSIS — E875 Hyperkalemia: Secondary | ICD-10-CM | POA: Diagnosis not present

## 2022-06-04 DIAGNOSIS — L989 Disorder of the skin and subcutaneous tissue, unspecified: Secondary | ICD-10-CM | POA: Diagnosis not present

## 2022-06-04 DIAGNOSIS — N1831 Chronic kidney disease, stage 3a: Secondary | ICD-10-CM | POA: Diagnosis not present

## 2022-06-24 ENCOUNTER — Encounter: Payer: Self-pay | Admitting: Urology

## 2022-06-24 ENCOUNTER — Ambulatory Visit (INDEPENDENT_AMBULATORY_CARE_PROVIDER_SITE_OTHER): Payer: Medicare Other | Admitting: Urology

## 2022-06-24 VITALS — BP 137/82 | HR 114 | Ht 63.0 in | Wt 250.0 lb

## 2022-06-24 DIAGNOSIS — N400 Enlarged prostate without lower urinary tract symptoms: Secondary | ICD-10-CM | POA: Diagnosis not present

## 2022-06-24 DIAGNOSIS — R310 Gross hematuria: Secondary | ICD-10-CM | POA: Diagnosis not present

## 2022-06-24 DIAGNOSIS — R319 Hematuria, unspecified: Secondary | ICD-10-CM

## 2022-06-24 LAB — URINALYSIS, ROUTINE W REFLEX MICROSCOPIC
Bilirubin, UA: NEGATIVE
Glucose, UA: NEGATIVE
Ketones, UA: NEGATIVE
Leukocytes,UA: NEGATIVE
Nitrite, UA: NEGATIVE
Protein,UA: NEGATIVE
Specific Gravity, UA: 1.015 (ref 1.005–1.030)
Urobilinogen, Ur: 1 mg/dL (ref 0.2–1.0)
pH, UA: 5 (ref 5.0–7.5)

## 2022-06-24 LAB — MICROSCOPIC EXAMINATION

## 2022-06-24 NOTE — Progress Notes (Signed)
History of Present Illness:  Underwent hematuria evaluation for gross painless hematuria in December 2021.  Upper tracts were unremarkable on CT hematuria protocol.  Cystoscopy was negative.  Urinary cytology was negative.  He is on Eliquis.  10.24.2023: Here for annual follow-up.  He has no significant voiding symptoms other than nocturia every 2 hours.  He has a good stream.  Feels like he empties well.  He does drink caffeinated/carbonated beverages in the evening.  He is on a morning diuretic.  He has had no gross hematuria. Past Medical History:  Diagnosis Date   Aortic atherosclerosis ([place])    Atrial fibrillation ([place])    Cardiomyopathy ([place])    Essential hypertension    Renal insufficiency    Possible history    Past Surgical History:  Procedure Laterality Date   HERNIA REPAIR      Home Medications:  Allergies as of 06/24/2022   No Known Allergies      Medication List        Accurate as of June 24, 2022  2:06 PM. If you have any questions, ask your nurse or doctor.          acetaminophen 500 MG tablet Commonly known as: TYLENOL Take 500-1,000 mg by mouth as needed.   amLODipine 5 MG tablet Commonly known as: NORVASC Take 1 tablet (5 mg total) by mouth daily.   apixaban 5 MG Tabs tablet Commonly known as: ELIQUIS Take 1 tablet (5 mg total) by mouth 2 (two) times daily.   bisoprolol 5 MG tablet Commonly known as: ZEBETA Take 1 tablet (5 mg total) by mouth daily.   cloNIDine 0.2 MG tablet Commonly known as: CATAPRES Take 0.2 mg by mouth 2 (two) times daily.   losartan 100 MG tablet Commonly known as: COZAAR Take 1 tablet by mouth daily.   meloxicam 7.5 MG tablet Commonly known as: MOBIC Take 7.5 mg by mouth daily.   OSTEO BI-FLEX ONE PER DAY PO Take 1 tablet by mouth daily.   pravastatin 40 MG tablet Commonly known as: PRAVACHOL Take 40 mg by mouth daily.        Allergies: No Known Allergies  Family History  Problem Relation Age of  Onset   Hypertension Father     Social History:  reports that he has never smoked. He has never used smokeless tobacco. He reports current alcohol use. He reports that he does not use drugs.  ROS: A complete review of systems was performed.  All systems are negative except for pertinent findings as noted.  Physical Exam:  Vital signs in last 24 hours: BP 137/82   Pulse (!) 114   Ht '5\' 3"'$  (1.6 m)   Wt 250 lb (113.4 kg)   BMI 44.29 kg/m  Constitutional:  Alert and oriented, No acute distress Cardiovascular: Regular rate  Respiratory: Normal respiratory effort Neurologic: Grossly intact, no focal deficits Psychiatric: Normal mood and affect  I have reviewed prior pt notes  I have reviewed urinalysis results-3-10 red cells per high-power field  I have independently reviewed prior imaging-prior CT scan reviewed     Impression/Assessment:  History of gross hematuria, patient on Eliquis.  Negative evaluation including cystoscopy, cytology and CT in 2021.  He does have microscopic hematuria.  Plan:  At this point, as he has had no significant issues over the past year, I will have him come back on an as-needed basis

## 2022-06-25 NOTE — Progress Notes (Unsigned)
Cardiology Office Note  Date: 06/26/2022   ID: Kevin Rios, DOB 1941/08/19, MRN [id number]  PCP:  Celene Squibb, MD  Cardiologist:  Rozann Lesches, MD Electrophysiologist:  None   Chief Complaint  Patient presents with   Cardiac follow-up    History of Present Illness: Kevin Rios is a 81 y.o. male last seen in April.  He is here for a routine visit.  Reports no palpitations or chest discomfort, NYHA class II dyspnea.  Heart rate is much better controlled today in atrial fibrillation.  I personally viewed his ECG which shows atrial fibrillation at 67 bpm.  We went over his medications which are outlined below.  Tolerating addition of bisoprolol.  No reported bleeding problems on Eliquis.  Requesting recent lab work from Dr. [place] Crane.  Past Medical History:  Diagnosis Date   Aortic atherosclerosis (HCC)    Atrial fibrillation ([place])    Cardiomyopathy ([place])    Essential hypertension    Renal insufficiency    Possible history    Past Surgical History:  Procedure Laterality Date   HERNIA REPAIR      Current Outpatient Medications  Medication Sig Dispense Refill   acetaminophen (TYLENOL) 500 MG tablet Take 500-1,000 mg by mouth as needed.     amLODipine (NORVASC) 5 MG tablet Take 1 tablet (5 mg total) by mouth daily. 30 tablet 6   apixaban (ELIQUIS) 5 MG TABS tablet Take 1 tablet (5 mg total) by mouth 2 (two) times daily. 60 tablet 6   bisoprolol (ZEBETA) 5 MG tablet Take 1 tablet (5 mg total) by mouth daily. 30 tablet 6   Boswellia-Glucosamine-Vit D (OSTEO BI-FLEX ONE PER DAY PO) Take 1 tablet by mouth daily.     cloNIDine (CATAPRES) 0.2 MG tablet Take 0.2 mg by mouth 2 (two) times daily.     losartan (COZAAR) 100 MG tablet Take 1 tablet by mouth daily.     meloxicam (MOBIC) 7.5 MG tablet Take 7.5 mg by mouth daily.      pravastatin (PRAVACHOL) 40 MG tablet Take 40 mg by mouth daily.     No current facility-administered medications for this visit.    Allergies:  Patient has no known allergies.   ROS:  No syncope.  Physical Exam: VS:  BP 138/76   Pulse 64   Ht '5\' 3"'$  (1.6 m)   Wt 256 lb (116.1 kg)   SpO2 96%   BMI 45.35 kg/m , BMI Body mass index is 45.35 kg/m.  Wt Readings from Last 3 Encounters:  06/26/22 256 lb (116.1 kg)  06/24/22 250 lb (113.4 kg)  12/19/21 267 lb 12.8 oz (121.5 kg)    General: Patient appears comfortable at rest. HEENT: Conjunctiva and lids normal. Neck: Supple, no elevated JVP or carotid bruits. Lungs: Clear to auscultation, nonlabored breathing at rest. Cardiac: Irregularly irregular, no S3 or significant systolic murmur. Extremities: No pitting edema.  ECG:  An ECG dated 06/27/2021 was personally reviewed today and demonstrated:  Atrial fibrillation.  Recent Labwork:  January 2023: Hemoglobin A1c 6.8%, cholesterol 148, triglycerides 69, HDL 56, LDL 78, BUN 17, creatinine 1.27, potassium 5.3, AST 18, ALT 17, hemoglobin 14.3, platelets 252  Other Studies Reviewed Today:  Echocardiogram 09/26/2021:  1. Left ventricular ejection fraction, by estimation, is 60 to 65%. The  left ventricle has normal function. The left ventricle has no regional  wall motion abnormalities. Left ventricular diastolic parameters are  indeterminate.   2. Right ventricular systolic function is normal. The  right ventricular  size is normal.   3. Left atrial size was mildly dilated.   4. The mitral valve is abnormal. Mild mitral valve regurgitation. No  evidence of mitral stenosis.   5. The tricuspid valve is abnormal.   6. The aortic valve is tricuspid. There is mild calcification of the  aortic valve. There is mild thickening of the aortic valve. Aortic valve  regurgitation is not visualized. No aortic stenosis is present.   Assessment and Plan:  1.  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  He is tolerating Eliquis at this time, heart rate much better controlled on bisoprolol.  ECG reviewed.   Requesting interval lab work from Dr. [place] Crane.  Continue with observation.  2.  HFrecEF, LVEF 60 to 65% by echocardiogram earlier in the year.  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  No changes to current regimen.  3.  Essential hypertension, on Norvasc, bisoprolol, clonidine, and Cozaar with follow-up by Dr. [place] Crane.  Medication Adjustments/Labs and Tests Ordered: Current medicines are reviewed at length with the patient today.  Concerns regarding medicines are outlined above.   Tests Ordered: Orders Placed This Encounter  Procedures   EKG 12-Lead    Medication Changes: No orders of the defined types were placed in this encounter.   Disposition:  Follow up  6 months.  Signed, Satira Sark, MD, [facility] 06/26/2022 11:49 AM    [facility] [place], [place], [place] [address] Phone: [phone]; Fax: [phone]

## 2022-06-26 ENCOUNTER — Ambulatory Visit: Payer: Medicare Other | Attending: Cardiology | Admitting: Cardiology

## 2022-06-26 ENCOUNTER — Encounter: Payer: Self-pay | Admitting: Cardiology

## 2022-06-26 VITALS — BP 138/76 | HR 64 | Ht 63.0 in | Wt 256.0 lb

## 2022-06-26 DIAGNOSIS — Z8679 Personal history of other diseases of the circulatory system: Secondary | ICD-10-CM | POA: Insufficient documentation

## 2022-06-26 DIAGNOSIS — I10 Essential (primary) hypertension: Secondary | ICD-10-CM | POA: Insufficient documentation

## 2022-06-26 DIAGNOSIS — I4821 Permanent atrial fibrillation: Secondary | ICD-10-CM | POA: Diagnosis not present

## 2022-06-26 NOTE — Patient Instructions (Signed)

## 2022-07-08 ENCOUNTER — Other Ambulatory Visit: Payer: Self-pay | Admitting: Cardiology

## 2022-07-21 DIAGNOSIS — C44519 Basal cell carcinoma of skin of other part of trunk: Secondary | ICD-10-CM | POA: Diagnosis not present

## 2022-09-11 DIAGNOSIS — E782 Mixed hyperlipidemia: Secondary | ICD-10-CM | POA: Diagnosis not present

## 2022-09-11 DIAGNOSIS — E1169 Type 2 diabetes mellitus with other specified complication: Secondary | ICD-10-CM | POA: Diagnosis not present

## 2022-09-22 DIAGNOSIS — Z08 Encounter for follow-up examination after completed treatment for malignant neoplasm: Secondary | ICD-10-CM | POA: Diagnosis not present

## 2022-09-22 DIAGNOSIS — Z85828 Personal history of other malignant neoplasm of skin: Secondary | ICD-10-CM | POA: Diagnosis not present

## 2022-10-06 ENCOUNTER — Telehealth: Payer: Self-pay | Admitting: *Deleted

## 2022-10-07 ENCOUNTER — Telehealth: Payer: Self-pay | Admitting: *Deleted

## 2022-10-07 NOTE — Patient Outreach (Signed)
  Care Coordination   10/07/2022 Name: Kevin Rios MRN: [id number] DOB: Sep 29, 1940   Care Coordination Outreach Attempts:  An unsuccessful telephone outreach was attempted today to offer the patient information about available care coordination services as a benefit of their health plan.  NP connected with [name] and began explaining the HEP and the line was disconnected. NP sent pt contact information and requested a call back.  Follow Up Plan:  Additional outreach attempts will be made to offer the patient care coordination information and services.   Encounter Outcome:  Pt. Refused   Care Coordination Interventions:  No, not indicated    SIG Annalyssa Thune C. Myrtie Neither, MSN, [facility] Gerontological Nurse Practitioner [facility] Care Management [phone]

## 2022-10-29 ENCOUNTER — Other Ambulatory Visit: Payer: Self-pay | Admitting: Cardiology

## 2022-12-22 DIAGNOSIS — E1169 Type 2 diabetes mellitus with other specified complication: Secondary | ICD-10-CM | POA: Diagnosis not present

## 2022-12-22 DIAGNOSIS — E782 Mixed hyperlipidemia: Secondary | ICD-10-CM | POA: Diagnosis not present

## 2022-12-26 ENCOUNTER — Encounter: Payer: Self-pay | Admitting: Internal Medicine

## 2022-12-26 DIAGNOSIS — R600 Localized edema: Secondary | ICD-10-CM | POA: Diagnosis not present

## 2022-12-26 DIAGNOSIS — H9193 Unspecified hearing loss, bilateral: Secondary | ICD-10-CM | POA: Diagnosis not present

## 2022-12-26 DIAGNOSIS — I129 Hypertensive chronic kidney disease with stage 1 through stage 4 chronic kidney disease, or unspecified chronic kidney disease: Secondary | ICD-10-CM | POA: Diagnosis not present

## 2022-12-26 DIAGNOSIS — I480 Paroxysmal atrial fibrillation: Secondary | ICD-10-CM | POA: Diagnosis not present

## 2022-12-26 DIAGNOSIS — M170 Bilateral primary osteoarthritis of knee: Secondary | ICD-10-CM | POA: Diagnosis not present

## 2022-12-26 DIAGNOSIS — E1122 Type 2 diabetes mellitus with diabetic chronic kidney disease: Secondary | ICD-10-CM | POA: Diagnosis not present

## 2022-12-26 DIAGNOSIS — N1831 Chronic kidney disease, stage 3a: Secondary | ICD-10-CM | POA: Diagnosis not present

## 2022-12-26 DIAGNOSIS — E875 Hyperkalemia: Secondary | ICD-10-CM | POA: Diagnosis not present

## 2022-12-26 DIAGNOSIS — E1169 Type 2 diabetes mellitus with other specified complication: Secondary | ICD-10-CM | POA: Diagnosis not present

## 2022-12-26 DIAGNOSIS — I10 Essential (primary) hypertension: Secondary | ICD-10-CM | POA: Diagnosis not present

## 2022-12-26 DIAGNOSIS — E782 Mixed hyperlipidemia: Secondary | ICD-10-CM | POA: Diagnosis not present

## 2023-01-12 ENCOUNTER — Other Ambulatory Visit: Payer: Self-pay | Admitting: Cardiology

## 2023-01-15 ENCOUNTER — Ambulatory Visit: Payer: Medicare Other | Attending: Cardiology | Admitting: Cardiology

## 2023-01-15 ENCOUNTER — Encounter: Payer: Self-pay | Admitting: Cardiology

## 2023-01-15 VITALS — BP 158/80 | HR 84 | Ht 63.0 in | Wt 245.8 lb

## 2023-01-15 DIAGNOSIS — Z8679 Personal history of other diseases of the circulatory system: Secondary | ICD-10-CM

## 2023-01-15 DIAGNOSIS — I4821 Permanent atrial fibrillation: Secondary | ICD-10-CM

## 2023-01-15 DIAGNOSIS — I10 Essential (primary) hypertension: Secondary | ICD-10-CM

## 2023-01-15 MED ORDER — APIXABAN 5 MG PO TABS: 5.0000 mg | ORAL_TABLET | Freq: Two times a day (BID) | ORAL | 6 refills | Status: DC

## 2023-01-15 MED ORDER — BISOPROLOL FUMARATE 5 MG PO TABS: 5.0000 mg | ORAL_TABLET | Freq: Every day | ORAL | 6 refills | Status: DC

## 2023-01-15 MED ORDER — LOSARTAN POTASSIUM 100 MG PO TABS: 100.0000 mg | ORAL_TABLET | Freq: Every day | ORAL | 6 refills | Status: DC

## 2023-01-15 NOTE — Progress Notes (Signed)
    Cardiology Office Note  Date: 01/15/2023   ID: Kevin Rios, DOB 26-Aug-1941, MRN [id number]  History of Present Illness: Kevin Rios is a 82 y.o. male last seen in October 2023.  He is here for a routine visit.  He does not report any exertional chest pain, no significant palpitations, no dizziness or syncope.  I reviewed his medications, he states that he has had trouble getting his bisoprolol refilled, we plan to update the pharmacy as he should continue on the 5 mg daily dose.  Otherwise his medicines are stable with the recent addition of Farxiga by Dr. Margo Aye.  I reviewed his recent lab work from April.  Physical Exam: VS:  BP (!) 158/80   Pulse 84   Ht 5\' 3"  (1.6 m)   Wt 245 lb 12.8 oz (111.5 kg)   SpO2 92%   BMI 43.54 kg/m , BMI Body mass index is 43.54 kg/m.  Wt Readings from Last 3 Encounters:  01/15/23 245 lb 12.8 oz (111.5 kg)  06/26/22 256 lb (116.1 kg)  06/24/22 250 lb (113.4 kg)    General: Patient appears comfortable at rest. HEENT: Conjunctiva and lids normal. Neck: Supple, no elevated JVP or carotid bruits. Lungs: Clear to auscultation, nonlabored breathing at rest. Cardiac: Irregularly irregular, no S3 or significant systolic murmur. Extremities: No pitting edema.  ECG:  An ECG dated 06/26/2022 was personally reviewed today and demonstrated:  Rate controlled atrial fibrillation.  Labwork:  April 2024: Hemoglobin 15, platelets 253, BUN 15, creatinine 1.42, potassium 5.3, AST 22, ALT 14, cholesterol 141, triglycerides 87, HDL 52, LDL 72, hemoglobin A1c 7.4%  Other Studies Reviewed Today:  Echocardiogram 09/26/2021:  1. Left ventricular ejection fraction, by estimation, is 60 to 65%. The  left ventricle has normal function. The left ventricle has no regional  wall motion abnormalities. Left ventricular diastolic parameters are  indeterminate.   2. Right ventricular systolic function is normal. The right ventricular  size is normal.   3. Left  atrial size was mildly dilated.   4. The mitral valve is abnormal. Mild mitral valve regurgitation. No  evidence of mitral stenosis.   5. The tricuspid valve is abnormal.   6. The aortic valve is tricuspid. There is mild calcification of the  aortic valve. There is mild thickening of the aortic valve. Aortic valve  regurgitation is not visualized. No aortic stenosis is present.   Assessment and Plan:  1.  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  He reports no active palpitations.  No spontaneous bleeding problems on Eliquis.  Plan to continue bisoprolol 5 mg daily, pharmacy updated.  I reviewed his recent lab work from April.  2.  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  3.  Essential hypertension.  Resuming bisoprolol.  He otherwise continues on Norvasc, clonidine, and Cozaar.  Disposition:  Follow up  6 months.  Signed, Jonelle Sidle, M.D., F.A.C.C. [facility] at [facility]

## 2023-01-15 NOTE — Patient Instructions (Addendum)

## 2023-02-23 DIAGNOSIS — R3129 Other microscopic hematuria: Secondary | ICD-10-CM | POA: Insufficient documentation

## 2023-02-23 DIAGNOSIS — R8290 Unspecified abnormal findings in urine: Secondary | ICD-10-CM | POA: Diagnosis not present

## 2023-04-16 DIAGNOSIS — Z125 Encounter for screening for malignant neoplasm of prostate: Secondary | ICD-10-CM | POA: Diagnosis not present

## 2023-04-16 DIAGNOSIS — E782 Mixed hyperlipidemia: Secondary | ICD-10-CM | POA: Diagnosis not present

## 2023-04-16 DIAGNOSIS — E1169 Type 2 diabetes mellitus with other specified complication: Secondary | ICD-10-CM | POA: Diagnosis not present

## 2023-04-22 ENCOUNTER — Encounter: Payer: Self-pay | Admitting: Internal Medicine

## 2023-04-22 DIAGNOSIS — R7301 Impaired fasting glucose: Secondary | ICD-10-CM | POA: Diagnosis not present

## 2023-04-22 DIAGNOSIS — I10 Essential (primary) hypertension: Secondary | ICD-10-CM | POA: Diagnosis not present

## 2023-04-22 DIAGNOSIS — E875 Hyperkalemia: Secondary | ICD-10-CM | POA: Diagnosis not present

## 2023-04-22 DIAGNOSIS — E782 Mixed hyperlipidemia: Secondary | ICD-10-CM | POA: Diagnosis not present

## 2023-04-22 DIAGNOSIS — N1831 Chronic kidney disease, stage 3a: Secondary | ICD-10-CM | POA: Diagnosis not present

## 2023-04-22 DIAGNOSIS — I480 Paroxysmal atrial fibrillation: Secondary | ICD-10-CM | POA: Diagnosis not present

## 2023-04-22 DIAGNOSIS — R600 Localized edema: Secondary | ICD-10-CM | POA: Diagnosis not present

## 2023-04-22 DIAGNOSIS — E1169 Type 2 diabetes mellitus with other specified complication: Secondary | ICD-10-CM | POA: Diagnosis not present

## 2023-04-22 DIAGNOSIS — E1122 Type 2 diabetes mellitus with diabetic chronic kidney disease: Secondary | ICD-10-CM | POA: Diagnosis not present

## 2023-04-22 DIAGNOSIS — R7303 Prediabetes: Secondary | ICD-10-CM | POA: Diagnosis not present

## 2023-04-22 DIAGNOSIS — I129 Hypertensive chronic kidney disease with stage 1 through stage 4 chronic kidney disease, or unspecified chronic kidney disease: Secondary | ICD-10-CM | POA: Diagnosis not present

## 2023-07-28 ENCOUNTER — Encounter: Payer: Self-pay | Admitting: Cardiology

## 2023-07-28 ENCOUNTER — Ambulatory Visit: Payer: Medicare Other | Attending: Cardiology | Admitting: Cardiology

## 2023-07-28 VITALS — BP 138/86 | HR 79 | Ht 63.0 in | Wt 246.6 lb

## 2023-07-28 DIAGNOSIS — I5032 Chronic diastolic (congestive) heart failure: Secondary | ICD-10-CM | POA: Insufficient documentation

## 2023-07-28 DIAGNOSIS — I4821 Permanent atrial fibrillation: Secondary | ICD-10-CM | POA: Diagnosis not present

## 2023-07-28 DIAGNOSIS — I10 Essential (primary) hypertension: Secondary | ICD-10-CM | POA: Diagnosis not present

## 2023-07-28 DIAGNOSIS — I5020 Unspecified systolic (congestive) heart failure: Secondary | ICD-10-CM

## 2023-07-28 NOTE — Patient Instructions (Addendum)

## 2023-07-28 NOTE — Progress Notes (Signed)
    Cardiology Office Note  Date: 07/28/2023   ID: Kevin Rios, DOB 06-05-1941, MRN [id number]  History of Present Illness: Kevin Rios is an 82 y.o. male last seen in May.  He is here for a routine visit.  He does not report any palpitations and has stable NYHA class II dyspnea.  I reviewed his medications.  He is not certain about details on his list, but states that he is taking the medications as directed.  No reported bleeding problems on Eliquis.  He is on bisoprolol mainly for heart rate control of atrial fibrillation.  Antihypertensive regimen has included Norvasc, clonidine, and Cozaar.  Also on Farxiga and Pravachol.  He continues to follow with Dr. Margo Aye.  I reviewed his lab work from August.  I reviewed his ECG today which shows rate controlled atrial fibrillation.  Physical Exam: VS:  BP 138/86 (BP Location: Right Arm)   Pulse 79   Ht 5\' 3"  (1.6 m)   Wt 246 lb 9.6 oz (111.9 kg)   SpO2 96%   BMI 43.68 kg/m , BMI Body mass index is 43.68 kg/m.  Wt Readings from Last 3 Encounters:  07/28/23 246 lb 9.6 oz (111.9 kg)  01/15/23 245 lb 12.8 oz (111.5 kg)  06/26/22 256 lb (116.1 kg)    General: Patient appears comfortable at rest. HEENT: Conjunctiva and lids normal. Neck: Supple, no elevated JVP or carotid bruits. Lungs: Clear to auscultation, nonlabored breathing at rest. Cardiac: Irregularly irregular, no gallop or significant murmur.  ECG:  An ECG dated 06/26/2022 was personally reviewed today and demonstrated:  Rate controlled atrial fibrillation.  Labwork:  August 2024: Hemoglobin 14.6, platelets 244, BUN 15, creatinine 1.43, potassium 5.7, AST 14, ALT 8, cholesterol 140, glycerides 59, HDL 56, LDL 72, hemoglobin A1c 6.9%  Other Studies Reviewed Today:  No interval cardiac testing for review today.  Assessment and Plan:  1.  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  He reports no palpitations and has good heart rate control on  bisoprolol.  Continue Eliquis for stroke prophylaxis.  He denies any spontaneous bleeding problems.  I reviewed his lab work from August.   2.  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  No change in current regimen which includes bisoprolol, Cozaar, and [place].  He has Lasix available as needed.   3.  Primary hypertension.  Continue Norvasc and clonidine in addition to the above.  Keep follow-up with Dr. Margo Aye.  Disposition:  Follow up  6 months.  Signed, Jonelle Sidle, M.D., F.A.C.C. [facility] at [facility]

## 2023-08-17 DIAGNOSIS — E782 Mixed hyperlipidemia: Secondary | ICD-10-CM | POA: Diagnosis not present

## 2023-08-17 DIAGNOSIS — E1169 Type 2 diabetes mellitus with other specified complication: Secondary | ICD-10-CM | POA: Diagnosis not present

## 2023-08-20 ENCOUNTER — Other Ambulatory Visit: Payer: Self-pay | Admitting: Cardiology

## 2023-08-21 ENCOUNTER — Encounter: Payer: Self-pay | Admitting: Internal Medicine

## 2023-08-21 DIAGNOSIS — E782 Mixed hyperlipidemia: Secondary | ICD-10-CM | POA: Diagnosis not present

## 2023-08-21 DIAGNOSIS — I10 Essential (primary) hypertension: Secondary | ICD-10-CM | POA: Diagnosis not present

## 2023-08-21 DIAGNOSIS — E1169 Type 2 diabetes mellitus with other specified complication: Secondary | ICD-10-CM | POA: Diagnosis not present

## 2023-08-21 DIAGNOSIS — H9193 Unspecified hearing loss, bilateral: Secondary | ICD-10-CM | POA: Diagnosis not present

## 2023-08-21 DIAGNOSIS — N1831 Chronic kidney disease, stage 3a: Secondary | ICD-10-CM | POA: Diagnosis not present

## 2023-08-21 DIAGNOSIS — E875 Hyperkalemia: Secondary | ICD-10-CM | POA: Diagnosis not present

## 2023-08-21 DIAGNOSIS — R809 Proteinuria, unspecified: Secondary | ICD-10-CM | POA: Diagnosis not present

## 2023-08-21 DIAGNOSIS — R600 Localized edema: Secondary | ICD-10-CM | POA: Diagnosis not present

## 2023-08-21 DIAGNOSIS — I480 Paroxysmal atrial fibrillation: Secondary | ICD-10-CM | POA: Diagnosis not present

## 2023-08-21 DIAGNOSIS — I129 Hypertensive chronic kidney disease with stage 1 through stage 4 chronic kidney disease, or unspecified chronic kidney disease: Secondary | ICD-10-CM | POA: Diagnosis not present

## 2023-08-21 DIAGNOSIS — R7301 Impaired fasting glucose: Secondary | ICD-10-CM | POA: Diagnosis not present

## 2023-08-31 ENCOUNTER — Other Ambulatory Visit: Payer: Self-pay | Admitting: Cardiology

## 2023-09-11 ENCOUNTER — Ambulatory Visit: Payer: Medicare Other | Admitting: Orthopedic Surgery

## 2023-09-21 ENCOUNTER — Other Ambulatory Visit: Payer: Self-pay | Admitting: Cardiology

## 2023-09-28 ENCOUNTER — Other Ambulatory Visit: Payer: Self-pay | Admitting: Cardiology

## 2023-10-05 ENCOUNTER — Other Ambulatory Visit: Payer: Self-pay | Admitting: Cardiology

## 2023-10-09 ENCOUNTER — Other Ambulatory Visit: Payer: Self-pay | Admitting: Cardiology

## 2023-12-14 DIAGNOSIS — E782 Mixed hyperlipidemia: Secondary | ICD-10-CM | POA: Diagnosis not present

## 2023-12-14 DIAGNOSIS — E1169 Type 2 diabetes mellitus with other specified complication: Secondary | ICD-10-CM | POA: Diagnosis not present

## 2023-12-21 DIAGNOSIS — I480 Paroxysmal atrial fibrillation: Secondary | ICD-10-CM | POA: Diagnosis not present

## 2023-12-21 DIAGNOSIS — M170 Bilateral primary osteoarthritis of knee: Secondary | ICD-10-CM | POA: Diagnosis not present

## 2023-12-21 DIAGNOSIS — I509 Heart failure, unspecified: Secondary | ICD-10-CM | POA: Insufficient documentation

## 2023-12-21 DIAGNOSIS — R600 Localized edema: Secondary | ICD-10-CM | POA: Diagnosis not present

## 2023-12-21 DIAGNOSIS — I10 Essential (primary) hypertension: Secondary | ICD-10-CM | POA: Diagnosis not present

## 2023-12-21 DIAGNOSIS — I5032 Chronic diastolic (congestive) heart failure: Secondary | ICD-10-CM | POA: Diagnosis not present

## 2023-12-21 DIAGNOSIS — E1169 Type 2 diabetes mellitus with other specified complication: Secondary | ICD-10-CM | POA: Diagnosis not present

## 2023-12-21 DIAGNOSIS — R809 Proteinuria, unspecified: Secondary | ICD-10-CM | POA: Diagnosis not present

## 2023-12-21 DIAGNOSIS — I130 Hypertensive heart and chronic kidney disease with heart failure and stage 1 through stage 4 chronic kidney disease, or unspecified chronic kidney disease: Secondary | ICD-10-CM | POA: Diagnosis not present

## 2023-12-21 DIAGNOSIS — N1831 Chronic kidney disease, stage 3a: Secondary | ICD-10-CM | POA: Diagnosis not present

## 2023-12-21 DIAGNOSIS — E782 Mixed hyperlipidemia: Secondary | ICD-10-CM | POA: Diagnosis not present

## 2023-12-21 DIAGNOSIS — Z87448 Personal history of other diseases of urinary system: Secondary | ICD-10-CM | POA: Diagnosis not present

## 2024-02-02 ENCOUNTER — Telehealth: Payer: Self-pay | Admitting: *Deleted

## 2024-02-02 DIAGNOSIS — L723 Sebaceous cyst: Secondary | ICD-10-CM | POA: Diagnosis not present

## 2024-02-02 DIAGNOSIS — Z6837 Body mass index (BMI) 37.0-37.9, adult: Secondary | ICD-10-CM | POA: Diagnosis not present

## 2024-02-02 DIAGNOSIS — I10 Essential (primary) hypertension: Secondary | ICD-10-CM | POA: Diagnosis not present

## 2024-02-02 NOTE — Telephone Encounter (Signed)
 Received referral from Zack Hall, MD for sebaceous cyst.   Notes reviewed as follows: On exam, approximately 3 x 2 cm intraepidermal cyst on left scrotum with several areas of draining out white caseous material and intertrigo on his groin area Given of high risk of infection in his groin area, advised to start doxycycline 100 mg BID x7 days Recommend using nystatin powder BID, warm compresses, keeping the area clean and dry, and alert us  to any spreading redness, purulent drainage, etc. Will also send referral to Dr. Larrie Po, General Surgeon, for further evaluation and possible excision given the location  Location of cyst is not appropriate for general surgery.   Recommended PCP refer to urology.

## 2024-02-03 NOTE — Telephone Encounter (Signed)
 Call placed to PCP and referral coordinator made aware.

## 2024-02-04 ENCOUNTER — Ambulatory Visit: Payer: Medicare Other | Attending: Cardiology | Admitting: Cardiology

## 2024-02-04 ENCOUNTER — Encounter: Payer: Self-pay | Admitting: Cardiology

## 2024-02-04 VITALS — BP 132/76 | HR 60 | Ht 63.0 in | Wt 243.0 lb

## 2024-02-04 DIAGNOSIS — I10 Essential (primary) hypertension: Secondary | ICD-10-CM

## 2024-02-04 DIAGNOSIS — I5032 Chronic diastolic (congestive) heart failure: Secondary | ICD-10-CM

## 2024-02-04 DIAGNOSIS — I5020 Unspecified systolic (congestive) heart failure: Secondary | ICD-10-CM

## 2024-02-04 DIAGNOSIS — I4821 Permanent atrial fibrillation: Secondary | ICD-10-CM

## 2024-02-04 NOTE — Patient Instructions (Signed)

## 2024-02-04 NOTE — Progress Notes (Signed)
    Cardiology Office Note  Date: 02/04/2024   ID: Kevin Rios, DOB 1940/10/11, MRN [id number]  History of Present Illness: Kevin Rios is an 83 y.o. male last seen in November 2024.  He is here for a routine visit.  Reports no sense of palpitations, no exertional chest pain, no dizziness or syncope.  We went over his medications.  He reports compliance with regimen, no obvious bleeding problems on Eliquis .  He continues to see Dr. Del Favia for primary care, I reviewed his interval lab work.  Physical Exam: VS:  BP 132/76   Pulse 60   Ht 5\' 3"  (1.6 m)   Wt 243 lb (110.2 kg)   SpO2 95%   BMI 43.05 kg/m , BMI Body mass index is 43.05 kg/m.  Wt Readings from Last 3 Encounters:  02/04/24 243 lb (110.2 kg)  07/28/23 246 lb 9.6 oz (111.9 kg)  01/15/23 245 lb 12.8 oz (111.5 kg)    General: Patient appears comfortable at rest. HEENT: Conjunctiva and lids normal. Neck: Supple, no elevated JVP or carotid bruits. Lungs: Clear to auscultation, nonlabored breathing at rest. Cardiac: Irregularly irregular, no significant murmur or gallop.  ECG:  An ECG dated 07/28/2023 was personally reviewed today and demonstrated:  Atrial fibrillation.  Labwork:  April 2025: Hemoglobin 16.2, platelets 238, BUN 13, creatinine 1.35, GFR 53, potassium 4.9, AST 19, ALT 12, cholesterol 142, triglycerides 80, HDL 47, LDL 79  Other Studies Reviewed Today:  No interval cardiac testing for review today.  Assessment and Plan:  1.  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  He remains asymptomatic in terms of palpitations and continues on bisoprolol  5 mg daily for rate control as well as Eliquis  5 mg twice daily for stroke prophylaxis.  I reviewed his interval lab work.  No changes made today.   2.  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  He is on bisoprolol  5 mg daily, Cozaar  100 mg daily, Farxiga 10 mg  daily, and Lasix 20 mg every other day.   3.  Primary hypertension.  No change in current regimen which also includes Norvasc  5 mg daily and clonidine 0.2 mg twice daily.  Keep follow-up with Dr. Del Favia.  Disposition:  Follow up 6 months.  Signed, Gerard Knight, M.D., F.A.C.C. [facility] at [facility]

## 2024-02-09 ENCOUNTER — Other Ambulatory Visit: Payer: Self-pay | Admitting: Cardiology

## 2024-02-09 ENCOUNTER — Telehealth: Payer: Self-pay | Admitting: Cardiology

## 2024-02-09 DIAGNOSIS — I4821 Permanent atrial fibrillation: Secondary | ICD-10-CM

## 2024-02-09 MED ORDER — APIXABAN 5 MG PO TABS
5.0000 mg | ORAL_TABLET | Freq: Two times a day (BID) | ORAL | 1 refills | Status: AC
Start: 2024-02-09 — End: ?

## 2024-02-09 NOTE — Telephone Encounter (Signed)
 Prescription refill request for Eliquis  received. Indication: Afib  Last office visit: 02/04/24 Londa Rival)  Scr: 1.46 (12/16/4)  Age: 83 Weight: 110.2kg  Appropriate dose. Refill sent.

## 2024-02-09 NOTE — Telephone Encounter (Signed)
*  STAT* If patient is at the pharmacy, call can be transferred to refill team.   1. Which medications need to be refilled? (please list name of each medication and dose if known) apixaban  (ELIQUIS ) 5 MG TABS tablet   2. Which pharmacy/location (including street and city if local pharmacy) is medication to be sent to? [facility] - [place], [place] - [address]   3. Do they need a 30 day or 90 day supply? 90

## 2024-02-09 NOTE — Telephone Encounter (Signed)
 Prescription refill request for Eliquis  received. Indication:afib Last office visit:6/25 Scr:1.46  12/24 Age: 83 Weight:110.2  kg  Prescription refilled

## 2024-03-07 ENCOUNTER — Ambulatory Visit (INDEPENDENT_AMBULATORY_CARE_PROVIDER_SITE_OTHER): Admitting: Urology

## 2024-03-07 ENCOUNTER — Encounter: Payer: Self-pay | Admitting: Urology

## 2024-03-07 ENCOUNTER — Ambulatory Visit: Admitting: Urology

## 2024-03-07 DIAGNOSIS — L723 Sebaceous cyst: Secondary | ICD-10-CM | POA: Insufficient documentation

## 2024-03-07 DIAGNOSIS — N401 Enlarged prostate with lower urinary tract symptoms: Secondary | ICD-10-CM | POA: Diagnosis not present

## 2024-03-07 DIAGNOSIS — R351 Nocturia: Secondary | ICD-10-CM

## 2024-03-07 LAB — URINALYSIS, ROUTINE W REFLEX MICROSCOPIC
Bilirubin, UA: NEGATIVE
Ketones, UA: NEGATIVE
Leukocytes,UA: NEGATIVE
Nitrite, UA: NEGATIVE
Protein,UA: NEGATIVE
Specific Gravity, UA: 1.01 (ref 1.005–1.030)
Urobilinogen, Ur: 0.2 mg/dL (ref 0.2–1.0)
pH, UA: 6 (ref 5.0–7.5)

## 2024-03-07 LAB — MICROSCOPIC EXAMINATION
Bacteria, UA: NONE SEEN
WBC, UA: NONE SEEN /HPF (ref 0–5)

## 2024-03-07 NOTE — Progress Notes (Signed)
 Name: Kevin Rios DOB: November 17, 1940 MRN: [id number]  History of Present Illness: [name] is a 83 y.o. male who presents today for follow up visit / to re-establish care at [facility].  Relevant History includes: 1. BPH with LUTS (nocturia).  - LUTS exacerbated by caffeine and diuretic use. 2. Prior gross painless hematuria.  - Negative workup in December 2021 including CT hematuria, cystoscopy, and voided cytology. 3. Chronic microscopic hematuria. 4. CKD stage 3.   At last visit with Dr. Matilda on 06/24/2022: Doing well. Plan was follow up PRN.  Recent history:  Referred back to Urology for sebaceous cyst. Per referral note:  - Seen by Dr. Shona (PCP) on 02/03/2024 for wound located on L side inner groin. Patient states that he notice spot a while ago. Patient states that spot bleeds and becomes raw and irritated due to sweating in area spot is located. - On exam, approximately 3 x 2 cm intraepidermal cyst on left scrotum with several areas of draining out white caseous material and intertrigo on his groin area.  - Treated with Doxycycline 100 mg BID x7 days and Nystatin powder. - General surgery declined referral they suggested referring to urology.   Today: He reports that his scrotal area has improved following the treatment by Dr. Shona. Today he denies scrotal pain, swelling, redness, warmth, tenderness, or persistent discharge. He states it acts like it done dried up and states recently nothing has come out when he mashed it.   He denies acute urinary complaints.   Medications: Current Outpatient Medications  Medication Sig Dispense Refill   acetaminophen  (TYLENOL ) 500 MG tablet Take 500-1,000 mg by mouth as needed.     amLODipine  (NORVASC ) 5 MG tablet TAKE (1) TABLET BY MOUTH ONCE A DAY. 30 tablet 6   apixaban  (ELIQUIS ) 5 MG TABS tablet Take 1 tablet (5 mg total) by mouth 2 (two) times daily. 180 tablet 1   bisoprolol  (ZEBETA ) 5 MG  tablet TAKE 1 TABLET BY MOUTH DAILY 30 tablet 6   Boswellia-Glucosamine-Vit D (OSTEO BI-FLEX ONE PER DAY PO) Take 1 tablet by mouth daily.     cloNIDine (CATAPRES) 0.2 MG tablet Take 0.2 mg by mouth 2 (two) times daily.     doxycycline (VIBRAMYCIN) 100 MG capsule Take 1 capsule twice a day by oral route.     FARXIGA 10 MG TABS tablet Take 10 mg by mouth daily.     furosemide (LASIX) 20 MG tablet Take 20 mg by mouth every other day.     losartan  (COZAAR ) 100 MG tablet TAKE 1 TABLET BY MOUTH ONCE A DAY 30 tablet 6   meloxicam (MOBIC) 7.5 MG tablet Take 7.5 mg by mouth daily.     nystatin (MYCOSTATIN/NYSTOP) powder APPLY TO THE AFFECTED AREA(S) BY TOPICAL ROUTE 2 TIMES PER DAY     pravastatin (PRAVACHOL) 40 MG tablet Take 40 mg by mouth daily.     No current facility-administered medications for this visit.    Allergies: No Known Allergies  Past Medical History:  Diagnosis Date   Aortic atherosclerosis (HCC)    Atrial fibrillation (HCC)    Cardiomyopathy (HCC)    Essential hypertension    Renal insufficiency    Possible history   Past Surgical History:  Procedure Laterality Date   HERNIA REPAIR     Family History  Problem Relation Age of Onset   Hypertension Father    Social History   Socioeconomic History   Marital status: Single  Spouse name: Not on file   Number of children: Not on file   Years of education: Not on file   Highest education level: Not on file  Occupational History   Not on file  Tobacco Use   Smoking status: Never   Smokeless tobacco: Never  Vaping Use   Vaping status: Never Used  Substance and Sexual Activity   Alcohol use: Yes    Comment: occassionally   Drug use: Never   Sexual activity: Not on file  Other Topics Concern   Not on file  Social History Narrative   Not on file   Social Drivers of Health   Financial Resource Strain: Not on file  Food Insecurity: Not on file  Transportation Needs: Not on file  Physical Activity: Not on  file  Stress: Not on file  Social Connections: Not on file  Intimate Partner Violence: Not on file    Review of Systems Constitutional: Patient denies any unintentional weight loss or change in strength lntegumentary: Patient denies any rashes or pruritus Cardiovascular: Patient denies chest pain or syncope Respiratory: Patient denies shortness of breath Gastrointestinal: Patient denies nausea, vomiting, constipation, or diarrhea  Musculoskeletal: Patient denies muscle cramps or weakness Neurologic: Patient denies convulsions or seizures Allergic/Immunologic: Patient denies recent allergic reaction(s) Hematologic/Lymphatic: Patient denies bleeding tendencies Endocrine: Patient denies heat/cold intolerance  GU: As per HPI.  OBJECTIVE There were no vitals filed for this visit. There is no height or weight on file to calculate BMI.  Physical Examination Constitutional: No obvious distress; patient is non-toxic appearing  Cardiovascular: No visible lower extremity edema.  Respiratory: The patient does not have audible wheezing/stridor; respirations do not appear labored  Gastrointestinal: Abdomen non-distended Musculoskeletal: Normal ROM of UEs  Skin: No obvious rashes/open sores  Neurologic: CN 2-12 grossly intact Psychiatric: Answered questions appropriately with normal affect  Hematologic/Lymphatic/Immunologic: No obvious bruises or sites of spontaneous bleeding  Urine microscopy: 3-10 RBC/hpf, glucosuria (secondary to [place] use)  Genitourinary: Penis is normal in appearance.  Scrotum is normal in appearance. Appears Resolved left superior lateral scrotal furuncle; no local induration, edema, discharge, fluctuance, crepitus, erythema, warmth, or tenderness to palpation. No rash or lesions to the groin or scrotum.  Chaperone offered for pelvic exam; patient declined.   ASSESSMENT Sebaceous cyst of scrotum  Benign prostatic hyperplasia with nocturia - Plan: Urinalysis,  Routine w reflex microscopic  We discussed finding of carbuncle on exam, which is a coalescence of several inflamed follicles into a single inflammatory mass from multiple follicles. No evidence of acute infection or purulent drainage at this time. He was advised not to manipulate the area or try to pop the carbuncle. He was instructed to do compresses with a wet warm washcloth with gentle firm pressure 3-4 times per day for 10 minutes at a time to promote reabsorption or drainage. We agreed to follow up on an as-needed basis. Pt verbalized understanding and agreement. All questions were answered.   PLAN Advised the following: 1. Return if symptoms worsen or fail to improve.  Orders Placed This Encounter  Procedures   Urinalysis, Routine w reflex microscopic    It has been explained that the patient is to follow regularly with their PCP in addition to all other providers involved in their care and to follow instructions provided by these respective offices. Patient advised to contact urology clinic if any urologic-pertaining questions, concerns, new symptoms or problems arise in the interim period.  There are no Patient Instructions on file for this visit.  Electronically signed by:  Lauraine JAYSON Oz, FNP   03/07/24    10:07 AM

## 2024-03-10 DIAGNOSIS — W57XXXA Bitten or stung by nonvenomous insect and other nonvenomous arthropods, initial encounter: Secondary | ICD-10-CM | POA: Diagnosis not present

## 2024-03-10 DIAGNOSIS — S80861A Insect bite (nonvenomous), right lower leg, initial encounter: Secondary | ICD-10-CM | POA: Diagnosis not present

## 2024-03-20 ENCOUNTER — Other Ambulatory Visit: Payer: Self-pay | Admitting: Cardiology

## 2024-04-15 DIAGNOSIS — E1169 Type 2 diabetes mellitus with other specified complication: Secondary | ICD-10-CM | POA: Diagnosis not present

## 2024-04-15 DIAGNOSIS — E782 Mixed hyperlipidemia: Secondary | ICD-10-CM | POA: Diagnosis not present

## 2024-04-21 DIAGNOSIS — E1165 Type 2 diabetes mellitus with hyperglycemia: Secondary | ICD-10-CM | POA: Diagnosis not present

## 2024-04-21 DIAGNOSIS — I480 Paroxysmal atrial fibrillation: Secondary | ICD-10-CM | POA: Diagnosis not present

## 2024-04-21 DIAGNOSIS — I130 Hypertensive heart and chronic kidney disease with heart failure and stage 1 through stage 4 chronic kidney disease, or unspecified chronic kidney disease: Secondary | ICD-10-CM | POA: Diagnosis not present

## 2024-04-21 DIAGNOSIS — I10 Essential (primary) hypertension: Secondary | ICD-10-CM | POA: Diagnosis not present

## 2024-04-21 DIAGNOSIS — E1159 Type 2 diabetes mellitus with other circulatory complications: Secondary | ICD-10-CM | POA: Diagnosis not present

## 2024-04-21 DIAGNOSIS — R809 Proteinuria, unspecified: Secondary | ICD-10-CM | POA: Diagnosis not present

## 2024-04-21 DIAGNOSIS — E875 Hyperkalemia: Secondary | ICD-10-CM | POA: Diagnosis not present

## 2024-04-21 DIAGNOSIS — R600 Localized edema: Secondary | ICD-10-CM | POA: Diagnosis not present

## 2024-04-21 DIAGNOSIS — E782 Mixed hyperlipidemia: Secondary | ICD-10-CM | POA: Diagnosis not present

## 2024-04-21 DIAGNOSIS — E1169 Type 2 diabetes mellitus with other specified complication: Secondary | ICD-10-CM | POA: Diagnosis not present

## 2024-04-21 DIAGNOSIS — I5032 Chronic diastolic (congestive) heart failure: Secondary | ICD-10-CM | POA: Diagnosis not present

## 2024-04-21 DIAGNOSIS — N1831 Chronic kidney disease, stage 3a: Secondary | ICD-10-CM | POA: Diagnosis not present

## 2024-04-25 ENCOUNTER — Other Ambulatory Visit: Payer: Self-pay | Admitting: Cardiology

## 2024-08-05 DIAGNOSIS — E1169 Type 2 diabetes mellitus with other specified complication: Secondary | ICD-10-CM | POA: Diagnosis not present

## 2024-08-05 DIAGNOSIS — E782 Mixed hyperlipidemia: Secondary | ICD-10-CM | POA: Diagnosis not present

## 2024-08-11 DIAGNOSIS — E1169 Type 2 diabetes mellitus with other specified complication: Secondary | ICD-10-CM | POA: Diagnosis not present

## 2024-08-11 DIAGNOSIS — N1831 Chronic kidney disease, stage 3a: Secondary | ICD-10-CM | POA: Diagnosis not present

## 2024-08-11 DIAGNOSIS — Z0000 Encounter for general adult medical examination without abnormal findings: Secondary | ICD-10-CM | POA: Diagnosis not present

## 2024-08-11 DIAGNOSIS — E875 Hyperkalemia: Secondary | ICD-10-CM | POA: Diagnosis not present

## 2024-08-11 DIAGNOSIS — I10 Essential (primary) hypertension: Secondary | ICD-10-CM | POA: Diagnosis not present

## 2024-08-11 DIAGNOSIS — Z87448 Personal history of other diseases of urinary system: Secondary | ICD-10-CM | POA: Diagnosis not present

## 2024-08-11 DIAGNOSIS — Z0001 Encounter for general adult medical examination with abnormal findings: Secondary | ICD-10-CM | POA: Diagnosis not present

## 2024-08-11 DIAGNOSIS — E782 Mixed hyperlipidemia: Secondary | ICD-10-CM | POA: Diagnosis not present

## 2024-08-11 DIAGNOSIS — I480 Paroxysmal atrial fibrillation: Secondary | ICD-10-CM | POA: Diagnosis not present

## 2024-08-11 DIAGNOSIS — R600 Localized edema: Secondary | ICD-10-CM | POA: Diagnosis not present

## 2024-08-11 DIAGNOSIS — R809 Proteinuria, unspecified: Secondary | ICD-10-CM | POA: Diagnosis not present

## 2024-08-11 DIAGNOSIS — I5032 Chronic diastolic (congestive) heart failure: Secondary | ICD-10-CM | POA: Diagnosis not present

## 2024-09-20 ENCOUNTER — Ambulatory Visit: Attending: Cardiology | Admitting: Cardiology

## 2024-09-20 ENCOUNTER — Encounter: Payer: Self-pay | Admitting: Cardiology

## 2024-09-20 VITALS — BP 132/80 | HR 75 | Ht 63.0 in | Wt 242.6 lb

## 2024-09-20 DIAGNOSIS — I10 Essential (primary) hypertension: Secondary | ICD-10-CM | POA: Insufficient documentation

## 2024-09-20 DIAGNOSIS — I5020 Unspecified systolic (congestive) heart failure: Secondary | ICD-10-CM | POA: Diagnosis not present

## 2024-09-20 DIAGNOSIS — I4821 Permanent atrial fibrillation: Secondary | ICD-10-CM | POA: Insufficient documentation

## 2024-09-20 NOTE — Progress Notes (Signed)
"  ° ° °  Cardiology Office Note  Date: 09/20/2024   ID: Kevin Rios, DOB Jul 23, 1941, MRN [id number]  History of Present Illness: Kevin Rios is an 84 y.o. male last seen in June 2025.  He is here for a follow-up visit.  He does not report any sense of palpitations, stable NYHA class II dyspnea, no dizziness or syncope.  No definite fluid retention.  His weight has been relatively stable.  We reviewed his medications, he reports compliance with current regimen, no spontaneous bleeding problems on Eliquis .  He had recent lab work with Dr. Shona in December 2025 which is noted below.  I reviewed his ECG today which shows atrial fibrillation at 75 bpm, low voltage in the limb leads.  Physical Exam: VS:  BP 132/80 (BP Location: Left Arm)   Pulse 75   Ht 5' 3 (1.6 m)   Wt 242 lb 9.6 oz (110 kg)   SpO2 95%   BMI 42.97 kg/m , BMI Body mass index is 42.97 kg/m.  Wt Readings from Last 3 Encounters:  09/20/24 242 lb 9.6 oz (110 kg)  02/04/24 243 lb (110.2 kg)  07/28/23 246 lb 9.6 oz (111.9 kg)    General: Patient appears comfortable at rest. HEENT: Conjunctiva and lids normal. Neck: Supple, no elevated JVP or carotid bruits. Lungs: Clear to auscultation, nonlabored breathing at rest. Cardiac: Irregularly irregular, no significant murmur or gallop. Extremities: No pitting edema.  ECG:  An ECG dated 07/28/2023 was personally reviewed today and demonstrated:  Atrial fibrillation.  Labwork:  December 2025: BUN 18, creatinine 1.43, GFR 49, potassium 5.1, AST 15, ALT 12, hemoglobin 16.3, platelets 270, cholesterol 164, triglycerides 66, HDL 62, LDL 89, hemoglobin A1c 7.5%  Other Studies Reviewed Today:  No interval cardiac testing for review today.  Assessment and Plan:  1.  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  Heart rate controlled and no reported palpitations on current regimen.  Continue bisoprolol  5 mg daily and Eliquis  5 mg twice daily.  I reviewed  his recent lab work.   2.  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  No fluid retention and stable NYHA class II dyspnea.  Currently on Cozaar  100 mg daily, Farxiga 10 mg daily, bisoprolol  5 mg daily, and Lasix 20 mg daily.   3.  Primary hypertension.  Continue with current medications, also on clonidine 0.2 mg twice daily and Norvasc  5 mg daily.  Keep follow-up with Dr. Shona.  Disposition:  Follow up 6 months.  Signed, Jayson JUDITHANN Sierras, M.D., F.A.C.C. [facility] at [facility]

## 2024-09-20 NOTE — Patient Instructions (Addendum)
